# Patient Record
Sex: Male | Born: 1990 | Race: White | Hispanic: No | Marital: Single | Smoking: Current every day smoker
Health system: Southern US, Community
[De-identification: ages and names within clinical notes are randomized; demographics above are authoritative.]

---

## 2010-06-06 ENCOUNTER — Emergency Department (HOSPITAL_COMMUNITY): Admission: EM | Admit: 2010-06-06 | Discharge: 2010-06-06 | Payer: Self-pay | Admitting: Emergency Medicine

## 2013-05-13 ENCOUNTER — Emergency Department (HOSPITAL_COMMUNITY): Payer: Self-pay

## 2013-05-13 ENCOUNTER — Emergency Department (HOSPITAL_COMMUNITY)
Admission: EM | Admit: 2013-05-13 | Discharge: 2013-05-13 | Discharge status: Against Medical Advice | Payer: Self-pay | Attending: Emergency Medicine | Admitting: Emergency Medicine

## 2013-05-13 ENCOUNTER — Encounter (HOSPITAL_COMMUNITY): Payer: Self-pay | Admitting: *Deleted

## 2013-05-13 DIAGNOSIS — S0993XA Unspecified injury of face, initial encounter: Secondary | ICD-10-CM | POA: Insufficient documentation

## 2013-05-13 DIAGNOSIS — T07XXXA Unspecified multiple injuries, initial encounter: Secondary | ICD-10-CM | POA: Insufficient documentation

## 2013-05-13 DIAGNOSIS — IMO0002 Reserved for concepts with insufficient information to code with codable children: Secondary | ICD-10-CM | POA: Insufficient documentation

## 2013-05-13 DIAGNOSIS — F17200 Nicotine dependence, unspecified, uncomplicated: Secondary | ICD-10-CM | POA: Insufficient documentation

## 2013-05-13 DIAGNOSIS — S0990XA Unspecified injury of head, initial encounter: Secondary | ICD-10-CM | POA: Insufficient documentation
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
## 2013-05-13 NOTE — ED Notes (Signed)
Pt not in room per x-ray, unable to locate pt
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
## 2013-05-14 NOTE — ED Provider Notes (Signed)
Medical screening examination/treatment/procedure(s) were performed by non-physician practitioner and as supervising physician I was immediately available for consultation/collaboration.   Glynn Octave, MD 05/14/13 [phone]

## 2013-05-31 ENCOUNTER — Emergency Department (HOSPITAL_COMMUNITY)
Admission: EM | Admit: 2013-05-31 | Discharge: 2013-05-31 | Disposition: A | Discharge status: Home / Self Care | Payer: Self-pay | Attending: Emergency Medicine | Admitting: Emergency Medicine

## 2013-05-31 ENCOUNTER — Emergency Department (HOSPITAL_COMMUNITY): Payer: Self-pay

## 2013-05-31 ENCOUNTER — Encounter (HOSPITAL_COMMUNITY): Payer: Self-pay | Admitting: Emergency Medicine

## 2013-05-31 DIAGNOSIS — Q762 Congenital spondylolisthesis: Secondary | ICD-10-CM | POA: Insufficient documentation

## 2013-05-31 DIAGNOSIS — Y9389 Activity, other specified: Secondary | ICD-10-CM | POA: Insufficient documentation

## 2013-05-31 DIAGNOSIS — M4306 Spondylolysis, lumbar region: Secondary | ICD-10-CM

## 2013-05-31 DIAGNOSIS — IMO0002 Reserved for concepts with insufficient information to code with codable children: Secondary | ICD-10-CM | POA: Insufficient documentation

## 2013-05-31 DIAGNOSIS — Y92410 Unspecified street and highway as the place of occurrence of the external cause: Secondary | ICD-10-CM | POA: Insufficient documentation

## 2013-05-31 DIAGNOSIS — F17200 Nicotine dependence, unspecified, uncomplicated: Secondary | ICD-10-CM | POA: Insufficient documentation

## 2013-05-31 MED ORDER — OXYCODONE-ACETAMINOPHEN 5-325 MG PO TABS
1.0000 | ORAL_TABLET | Freq: Once | ORAL | Status: AC
  Administered 2013-05-31: 1 via ORAL
  Filled 2013-05-31: qty 1

## 2013-05-31 MED ORDER — OXYCODONE-ACETAMINOPHEN 5-325 MG PO TABS: 1.0000 | ORAL_TABLET | ORAL | Status: DC | PRN
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
## 2013-06-02 NOTE — ED Provider Notes (Signed)
Medical screening examination/treatment/procedure(s) were performed by non-physician practitioner and as supervising physician I was immediately available for consultation/collaboration.  Adelaida Reindel, MD 06/02/13 1238 
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
## 2014-02-07 ENCOUNTER — Encounter (HOSPITAL_COMMUNITY): Payer: Self-pay | Admitting: Emergency Medicine

## 2014-02-07 ENCOUNTER — Emergency Department (HOSPITAL_COMMUNITY)
Admission: EM | Admit: 2014-02-07 | Discharge: 2014-02-07 | Disposition: A | Discharge status: Home / Self Care | Payer: Self-pay | Attending: Emergency Medicine | Admitting: Emergency Medicine

## 2014-02-07 DIAGNOSIS — R51 Headache: Secondary | ICD-10-CM | POA: Insufficient documentation

## 2014-02-07 DIAGNOSIS — K089 Disorder of teeth and supporting structures, unspecified: Secondary | ICD-10-CM | POA: Insufficient documentation

## 2014-02-07 DIAGNOSIS — F17200 Nicotine dependence, unspecified, uncomplicated: Secondary | ICD-10-CM | POA: Insufficient documentation

## 2014-02-07 DIAGNOSIS — K0889 Other specified disorders of teeth and supporting structures: Secondary | ICD-10-CM

## 2014-02-07 MED ORDER — HYDROCODONE-ACETAMINOPHEN 5-325 MG PO TABS
ORAL_TABLET | ORAL | Status: AC
  Filled 2014-02-07: qty 2

## 2014-02-07 MED ORDER — AMOXICILLIN 500 MG PO CAPS: 500.0000 mg | ORAL_CAPSULE | Freq: Three times a day (TID) | ORAL | Status: DC

## 2014-02-07 MED ORDER — HYDROCODONE-ACETAMINOPHEN 5-325 MG PO TABS
2.0000 | ORAL_TABLET | Freq: Once | ORAL | Status: AC
  Administered 2014-02-07: 2 via ORAL

## 2014-02-07 MED ORDER — HYDROCODONE-ACETAMINOPHEN 5-325 MG PO TABS: 2.0000 | ORAL_TABLET | ORAL | Status: DC | PRN

## 2014-02-07 NOTE — ED Provider Notes (Signed)
Medical screening examination/treatment/procedure(s) were performed by non-physician practitioner and as supervising physician I was immediately available for consultation/collaboration.  Gerhard Munchobert Deklen Popelka, MD 02/07/14 [phone]

## 2014-02-07 NOTE — ED Provider Notes (Signed)
CSN: [id number]     Arrival date & time 02/07/14  1839 History   First MD Initiated Contact with Patient 02/07/14 1919     Chief Complaint  Patient presents with  . Dental Pain     (Consider location/radiation/quality/duration/timing/severity/associated sxs/prior Treatment) Patient is a 23 y.o. male presenting with tooth pain. The history is provided by the patient. No language interpreter was used.  Dental Pain Location:  Lower Lower teeth location:  20/LL 2nd bicuspid Quality:  Aching Severity:  Moderate Onset quality:  Gradual Duration:  1 day Timing:  Constant Progression:  Worsening Chronicity:  New Relieved by:  Nothing Worsened by:  Nothing tried Ineffective treatments:  None tried Associated symptoms: facial pain   Associated symptoms: no congestion   Risk factors: no alcohol problem     History reviewed. No pertinent past medical history. History reviewed. No pertinent past surgical history. History reviewed. No pertinent family history. History  Substance Use Topics  . Smoking status: Current Every Day Smoker -- 1.00 packs/day    Types: Cigarettes  . Smokeless tobacco: Never Used  . Alcohol Use: Yes     Comment: social    Review of Systems  HENT: Positive for dental problem. Negative for congestion.   All other systems reviewed and are negative.      Allergies  Review of patient's allergies indicates no known allergies.  Home Medications   Current Outpatient Rx  Name  Route  Sig  Dispense  Refill  . ibuprofen (ADVIL,MOTRIN) 800 MG tablet   Oral   Take 800 mg by mouth every 8 (eight) hours as needed.         Marland Kitchen. amoxicillin (AMOXIL) 500 MG capsule   Oral   Take 1 capsule (500 mg total) by mouth 3 (three) times daily.   21 capsule   0   . HYDROcodone-acetaminophen (NORCO/VICODIN) 5-325 MG per tablet   Oral   Take 2 tablets by mouth every 4 (four) hours as needed.   10 tablet   0   . oxyCODONE-acetaminophen (PERCOCET/ROXICET) 5-325 MG per  tablet   Oral   Take 1 tablet by mouth every 4 (four) hours as needed for pain.   20 tablet   0    BP 128/67  Pulse 82  Temp(Src) 99 F (37.2 C) (Oral)  Resp 18  Ht 6' (1.829 m)  Wt 165 lb (74.844 kg)  BMI 22.37 kg/m2  SpO2 100% Physical Exam  Nursing note and vitals reviewed. Constitutional: He appears well-developed and well-nourished.  HENT:  Head: Normocephalic.  Multiple broken teeth,    Eyes: Conjunctivae are normal. Pupils are equal, round, and reactive to light.  Neck: Normal range of motion.  Cardiovascular: Normal rate.   Pulmonary/Chest: Effort normal.  Musculoskeletal: Normal range of motion.  Neurological: He is alert.  Skin: Skin is warm.  Psychiatric: He has a normal mood and affect.    ED Course  Procedures (including critical care time) Labs Review Labs Reviewed - No data to display Imaging Review No results found.   EKG Interpretation None      MDM   Final diagnoses:  Toothache    Hydrocodone and amoxicillian   Dental referrals    Elson AreasLeslie K Sofia, PA-C 02/07/14 1945

## 2014-02-07 NOTE — ED Notes (Signed)
Pt c/o L lower jaw dental pain, onset last night.

## 2014-02-07 NOTE — Discharge Instructions (Signed)

## 2014-10-24 ENCOUNTER — Emergency Department (HOSPITAL_COMMUNITY): Payer: No Typology Code available for payment source

## 2014-10-24 ENCOUNTER — Emergency Department (HOSPITAL_COMMUNITY)
Admission: EM | Admit: 2014-10-24 | Discharge: 2014-10-24 | Disposition: A | Discharge status: Home / Self Care | Payer: No Typology Code available for payment source | Attending: Emergency Medicine | Admitting: Emergency Medicine

## 2014-10-24 ENCOUNTER — Encounter (HOSPITAL_COMMUNITY): Payer: Self-pay | Admitting: Cardiology

## 2014-10-24 DIAGNOSIS — Y9389 Activity, other specified: Secondary | ICD-10-CM | POA: Insufficient documentation

## 2014-10-24 DIAGNOSIS — Y998 Other external cause status: Secondary | ICD-10-CM | POA: Insufficient documentation

## 2014-10-24 DIAGNOSIS — F1110 Opioid abuse, uncomplicated: Secondary | ICD-10-CM | POA: Insufficient documentation

## 2014-10-24 DIAGNOSIS — F1310 Sedative, hypnotic or anxiolytic abuse, uncomplicated: Secondary | ICD-10-CM | POA: Insufficient documentation

## 2014-10-24 DIAGNOSIS — S0990XA Unspecified injury of head, initial encounter: Secondary | ICD-10-CM | POA: Insufficient documentation

## 2014-10-24 DIAGNOSIS — Z79899 Other long term (current) drug therapy: Secondary | ICD-10-CM | POA: Insufficient documentation

## 2014-10-24 DIAGNOSIS — Y92410 Unspecified street and highway as the place of occurrence of the external cause: Secondary | ICD-10-CM | POA: Insufficient documentation

## 2014-10-24 DIAGNOSIS — Z720 Tobacco use: Secondary | ICD-10-CM | POA: Insufficient documentation

## 2014-10-24 DIAGNOSIS — F1910 Other psychoactive substance abuse, uncomplicated: Secondary | ICD-10-CM

## 2014-10-24 LAB — BASIC METABOLIC PANEL
ANION GAP: 8 (ref 5–15)
BUN: 5 mg/dL — ABNORMAL LOW (ref 6–23)
CALCIUM: 8.6 mg/dL (ref 8.4–10.5)
CO2: 29 meq/L (ref 19–32)
Chloride: 106 mEq/L (ref 96–112)
Creatinine, Ser: 0.75 mg/dL (ref 0.50–1.35)
GFR calc non Af Amer: >90 mL/min (ref >90)
Glucose, Bld: 113 mg/dL — ABNORMAL HIGH (ref 70–99)
POTASSIUM: 4.5 meq/L (ref 3.7–5.3)
Sodium: 143 mEq/L (ref 137–147)

## 2014-10-24 LAB — URINALYSIS, ROUTINE W REFLEX MICROSCOPIC
Bilirubin Urine: NEGATIVE
GLUCOSE, UA: NEGATIVE mg/dL
Hgb urine dipstick: NEGATIVE
Ketones, ur: NEGATIVE mg/dL
LEUKOCYTES UA: NEGATIVE
NITRITE: NEGATIVE
PH: 7.5 (ref 5.0–8.0)
PROTEIN: NEGATIVE mg/dL
Specific Gravity, Urine: <1.005 — ABNORMAL LOW (ref 1.005–1.030)
Urobilinogen, UA: 0.2 mg/dL (ref 0.0–1.0)

## 2014-10-24 LAB — CBC
HEMATOCRIT: 44.2 % (ref 39.0–52.0)
HEMOGLOBIN: 14.5 g/dL (ref 13.0–17.0)
MCH: 29.1 pg (ref 26.0–34.0)
MCHC: 32.8 g/dL (ref 30.0–36.0)
MCV: 88.8 fL (ref 78.0–100.0)
PLATELETS: 163 10*3/uL (ref 150–400)
RBC: 4.98 MIL/uL (ref 4.22–5.81)
RDW: 14.4 % (ref 11.5–15.5)
WBC: 4.7 10*3/uL (ref 4.0–10.5)

## 2014-10-24 LAB — RAPID URINE DRUG SCREEN, HOSP PERFORMED
AMPHETAMINES: NOT DETECTED
Barbiturates: NOT DETECTED
Benzodiazepines: POSITIVE — AB
COCAINE: NOT DETECTED
Opiates: POSITIVE — AB
TETRAHYDROCANNABINOL: NOT DETECTED

## 2014-10-24 LAB — ETHANOL

## 2014-10-24 MED ORDER — SODIUM CHLORIDE 0.9 % IV SOLN
INTRAVENOUS | Status: DC
  Administered 2014-10-24: 16:00:00 via INTRAVENOUS

## 2014-10-24 MED ORDER — FENTANYL CITRATE 0.05 MG/ML IJ SOLN
50.0000 ug | Freq: Once | INTRAMUSCULAR | Status: AC
  Administered 2014-10-24: 50 ug via INTRAVENOUS
  Filled 2014-10-24: qty 2

## 2014-10-24 NOTE — ED Notes (Signed)
MD at bedside. 
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
## 2014-10-24 NOTE — ED Notes (Signed)
Pt up in room moving around.  Removed c-collar.  Pulled IV out. .  Mom at bedside.  Advised pt that Dr. Elesa MassedWard was notified and she was coming to see the pt in a few minutes.  Pt states "I'm  out of here".  Advised mother that if  he leaves not to let him drive.
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
## 2014-10-24 NOTE — ED Notes (Signed)
Pt pulled c-collar off and broke the collar.  New c-collar applied.  Advised pt to leave on.

## 2014-10-24 NOTE — ED Notes (Addendum)
Back board removed.  c-collar remains on.  Pt tender upper and lower back.  Advised to leave c-collar on.

## 2014-10-24 NOTE — ED Notes (Signed)
Dr. Elesa MassedWard in room to talk with pt.  Advised pt that he would not be allowed to leave,  That he will be IVC'd and forced to stay.  Commitment paperwork being completed.

## 2014-10-24 NOTE — ED Notes (Signed)
Pt stating he wants to leave. States "I am an adult. I can leave if I want to" RN notified.

## 2014-10-24 NOTE — ED Notes (Signed)
Pt in bathroom smoking.
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
## 2017-12-17 ENCOUNTER — Emergency Department (HOSPITAL_COMMUNITY)
Admission: EM | Admit: 2017-12-17 | Discharge: 2017-12-17 | Disposition: A | Discharge status: Home / Self Care | Payer: Self-pay | Attending: Emergency Medicine | Admitting: Emergency Medicine

## 2017-12-17 ENCOUNTER — Other Ambulatory Visit: Payer: Self-pay

## 2017-12-17 ENCOUNTER — Encounter (HOSPITAL_COMMUNITY): Payer: Self-pay | Admitting: Emergency Medicine

## 2017-12-17 DIAGNOSIS — F17210 Nicotine dependence, cigarettes, uncomplicated: Secondary | ICD-10-CM | POA: Insufficient documentation

## 2017-12-17 DIAGNOSIS — N489 Disorder of penis, unspecified: Secondary | ICD-10-CM | POA: Insufficient documentation

## 2017-12-17 MED ORDER — PENICILLIN G BENZATHINE 1200000 UNIT/2ML IM SUSP
2.4000 10*6.[IU] | Freq: Once | INTRAMUSCULAR | Status: AC
  Administered 2017-12-17: 2.4 10*6.[IU] via INTRAMUSCULAR
  Filled 2017-12-17: qty 4

## 2017-12-17 NOTE — ED Notes (Signed)
Pt alert & oriented x4, stable gait. Patient given discharge instructions, paperwork & prescription(s). Patient  instructed to stop at the registration desk to finish any additional paperwork. Patient verbalized understanding. Pt left department w/ no further questions. 

## 2017-12-17 NOTE — ED Notes (Signed)
Pt has abscess to the left side on the base of his penis. Site was opened last night by pt some redness & drainage noted. Area hard around the opening.

## 2017-12-17 NOTE — ED Triage Notes (Signed)
Left sided groin abscess, popped last night, continues to hurt

## 2017-12-17 NOTE — ED Provider Notes (Signed)
[facility] EMERGENCY DEPARTMENT Provider Note   CSN: [id number] Arrival date & time: 12/17/17  2009     History   Chief Complaint Chief Complaint  Patient presents with  . Abscess    HPI Leonard Vargas is a 27 y.o. male.  The history is provided by the patient. No language interpreter was used.  Abscess  Size:  3mm Abscess quality: redness   Red streaking: no   Progression:  Worsening Chronicity:  New Relieved by:  Nothing Worsened by:  Nothing Ineffective treatments:  None tried Pt complains of a sore at the base of his penis.  Pt admits to std risk  History reviewed. No pertinent past medical history.  There are no active problems to display for this patient.   History reviewed. No pertinent surgical history.     Home Medications    Prior to Admission medications   Medication Sig Start Date End Date Taking? Authorizing Provider  amoxicillin (AMOXIL) 500 MG capsule Take 1 capsule (500 mg total) by mouth 3 (three) times daily. Patient not taking: Reported on 10/24/2014 02/07/14   Elson Areas, PA-C  HYDROcodone-acetaminophen (NORCO/VICODIN) 5-325 MG per tablet Take 2 tablets by mouth every 4 (four) hours as needed. Patient not taking: Reported on 10/24/2014 02/07/14   Elson Areas, PA-C  oxyCODONE-acetaminophen (PERCOCET/ROXICET) 5-325 MG per tablet Take 1 tablet by mouth every 4 (four) hours as needed for pain. Patient not taking: Reported on 10/24/2014 05/31/13   Burgess Amor, PA-C    Family History No family history on file.  Social History Social History   Tobacco Use  . Smoking status: Current Every Day Smoker    Packs/day: 1.00    Types: Cigarettes  . Smokeless tobacco: Never Used  Substance Use Topics  . Alcohol use: Yes    Comment: social  . Drug use: No     Allergies   Patient has no known allergies.   Review of Systems Review of Systems  All other systems reviewed and are negative.    Physical Exam Updated Vital Signs BP  (!) 155/94 (BP Location: Right Arm)   Pulse 78   Temp 97.9 F (36.6 C) (Oral)   Resp 18   Ht 6' (1.829 m)   Wt 81.6 kg (180 lb)   SpO2 100%   BMI 24.41 kg/m   Physical Exam  Constitutional: He is oriented to person, place, and time. He appears well-developed and well-nourished.  Genitourinary: No penile tenderness.  Genitourinary Comments: 3mm red sore base of penis  Musculoskeletal: Normal range of motion.  Neurological: He is alert and oriented to person, place, and time.  Skin: Skin is warm.  Psychiatric: He has a normal mood and affect.  Nursing note and vitals reviewed.    ED Treatments / Results  Labs (all labs ordered are listed, but only abnormal results are displayed) Labs Reviewed  RPR  GC/CHLAMYDIA PROBE AMP ([place]) NOT AT [facility]    EKG  EKG Interpretation None       Radiology No results found.  Procedures Procedures (including critical care time)  Medications Ordered in ED Medications  penicillin g benzathine (BICILLIN LA) 1200000 UNIT/2ML injection 2.4 Million Units (not administered)     Initial Impression / Assessment and Plan / ED Course  I have reviewed the triage vital signs and the nursing notes.  Pertinent labs & imaging results that were available during my care of the patient were reviewed by me and considered in my medical decision  making (see chart for details).     Pt counseled on std risk, safe sex.  Pt advised to follow up at health department for recheck  Final Clinical Impressions(s) / ED Diagnoses   Final diagnoses:  Penile lesion    ED Discharge Orders    None     Meds ordered this encounter  Medications  . penicillin g benzathine (BICILLIN LA) 1200000 UNIT/2ML injection 2.4 Million Units    Order Specific Question:   Antibiotic Indication:    Answer:   Syphilis      Osie CheeksSofia, Leslie K, PA-C 12/17/17 2136    Mancel BaleWentz, Elliott, MD 12/17/17 [phone]

## 2017-12-19 LAB — GC/CHLAMYDIA PROBE AMP (~~LOC~~) NOT AT ARMC
Chlamydia: NEGATIVE
Neisseria Gonorrhea: POSITIVE — AB

## 2017-12-19 LAB — RPR: RPR: NONREACTIVE

## 2023-05-25 ENCOUNTER — Other Ambulatory Visit: Payer: Self-pay

## 2023-05-25 ENCOUNTER — Emergency Department (HOSPITAL_COMMUNITY): Payer: 59

## 2023-05-25 ENCOUNTER — Emergency Department (HOSPITAL_COMMUNITY)
Admission: EM | Admit: 2023-05-25 | Discharge: 2023-05-25 | Disposition: A | Discharge status: Home / Self Care | Payer: 59 | Attending: Emergency Medicine | Admitting: Emergency Medicine

## 2023-05-25 DIAGNOSIS — R000 Tachycardia, unspecified: Secondary | ICD-10-CM | POA: Insufficient documentation

## 2023-05-25 DIAGNOSIS — R519 Headache, unspecified: Secondary | ICD-10-CM | POA: Insufficient documentation

## 2023-05-25 DIAGNOSIS — M79641 Pain in right hand: Secondary | ICD-10-CM | POA: Diagnosis not present

## 2023-05-25 DIAGNOSIS — Y92410 Unspecified street and highway as the place of occurrence of the external cause: Secondary | ICD-10-CM | POA: Diagnosis not present

## 2023-05-25 LAB — COMPREHENSIVE METABOLIC PANEL
ALT: 15 U/L (ref 0–44)
AST: 20 U/L (ref 15–41)
Albumin: 3.5 g/dL (ref 3.5–5.0)
Alkaline Phosphatase: 53 U/L (ref 38–126)
Anion gap: 8 (ref 5–15)
BUN: 10 mg/dL (ref 6–20)
CO2: 26 mmol/L (ref 22–32)
Calcium: 8.4 mg/dL — ABNORMAL LOW (ref 8.9–10.3)
Chloride: 102 mmol/L (ref 98–111)
Creatinine, Ser: 0.75 mg/dL (ref 0.61–1.24)
GFR, Estimated: >60 mL/min (ref >60)
Glucose, Bld: 113 mg/dL — ABNORMAL HIGH (ref 70–99)
Potassium: 4 mmol/L (ref 3.5–5.1)
Sodium: 136 mmol/L (ref 135–145)
Total Bilirubin: 0.4 mg/dL (ref 0.3–1.2)
Total Protein: 7.3 g/dL (ref 6.5–8.1)

## 2023-05-25 LAB — CBC WITH DIFFERENTIAL/PLATELET
Abs Immature Granulocytes: 0.04 10*3/uL (ref 0.00–0.07)
Basophils Absolute: 0 10*3/uL (ref 0.0–0.1)
Basophils Relative: 0 %
Eosinophils Absolute: 0.1 10*3/uL (ref 0.0–0.5)
Eosinophils Relative: 1 %
HCT: 36.8 % — ABNORMAL LOW (ref 39.0–52.0)
Hemoglobin: 12.1 g/dL — ABNORMAL LOW (ref 13.0–17.0)
Immature Granulocytes: 1 %
Lymphocytes Relative: 25 %
Lymphs Abs: 1.6 10*3/uL (ref 0.7–4.0)
MCH: 29.3 pg (ref 26.0–34.0)
MCHC: 32.9 g/dL (ref 30.0–36.0)
MCV: 89.1 fL (ref 80.0–100.0)
Monocytes Absolute: 0.5 10*3/uL (ref 0.1–1.0)
Monocytes Relative: 8 %
Neutro Abs: 4.4 10*3/uL (ref 1.7–7.7)
Neutrophils Relative %: 65 %
Platelets: 203 10*3/uL (ref 150–400)
RBC: 4.13 MIL/uL — ABNORMAL LOW (ref 4.22–5.81)
RDW: 11.9 % (ref 11.5–15.5)
WBC Morphology: ABNORMAL
WBC: 6.2 10*3/uL (ref 4.0–10.5)
nRBC: 0 % (ref 0.0–0.2)

## 2023-05-25 LAB — CBG MONITORING, ED: Glucose-Capillary: 104 mg/dL — ABNORMAL HIGH (ref 70–99)

## 2023-05-25 MED ORDER — SODIUM CHLORIDE 0.9 % IV BOLUS: 1000.0000 mL | Freq: Once | INTRAVENOUS | Status: DC

## 2023-05-25 MED ORDER — NALOXONE HCL 4 MG/0.1ML NA LIQD: 1.0000 | Freq: Once | NASAL | 0 refills | Status: AC
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
## 2023-05-25 NOTE — ED Notes (Signed)
Nurse notified of patient nodding off in bed. See documentation for vitals.

## 2023-05-25 NOTE — ED Notes (Signed)
Pt noted to have decreased LOC. Endorses drug use. Upgrading ESI to 2 based on decreased LOC and VS.
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
## 2023-05-25 NOTE — ED Notes (Signed)
Pt with desat to 82%, RR 4, HR 132, diaphoretic. SaO2 and RR improved to 92% and RR 8 when patient awoke with sternal rub. Pinpoint pupils. Notified Dr. Particia Nearing, coming to assess patient.

## 2023-05-25 NOTE — ED Notes (Signed)
Pt stated he would like to leave AMA at this time. Particia Nearing, MD made aware. Pt in NAD.

## 2023-05-25 NOTE — ED Notes (Signed)
EDP at bedside  

## 2023-08-01 ENCOUNTER — Other Ambulatory Visit: Payer: Self-pay

## 2023-08-01 ENCOUNTER — Encounter (HOSPITAL_COMMUNITY): Payer: Self-pay | Admitting: Emergency Medicine

## 2023-08-01 ENCOUNTER — Emergency Department (HOSPITAL_COMMUNITY)
Admission: EM | Admit: 2023-08-01 | Discharge: 2023-08-01 | Discharge status: Against Medical Advice | Payer: 59 | Attending: Emergency Medicine | Admitting: Emergency Medicine

## 2023-08-01 ENCOUNTER — Emergency Department (HOSPITAL_COMMUNITY): Payer: 59

## 2023-08-01 DIAGNOSIS — N50819 Testicular pain, unspecified: Secondary | ICD-10-CM | POA: Insufficient documentation

## 2023-08-01 DIAGNOSIS — Z113 Encounter for screening for infections with a predominantly sexual mode of transmission: Secondary | ICD-10-CM | POA: Diagnosis present

## 2023-08-01 DIAGNOSIS — R791 Abnormal coagulation profile: Secondary | ICD-10-CM | POA: Insufficient documentation

## 2023-08-01 DIAGNOSIS — R000 Tachycardia, unspecified: Secondary | ICD-10-CM | POA: Diagnosis not present

## 2023-08-01 DIAGNOSIS — R3915 Urgency of urination: Secondary | ICD-10-CM | POA: Diagnosis not present

## 2023-08-01 DIAGNOSIS — R509 Fever, unspecified: Secondary | ICD-10-CM | POA: Diagnosis not present

## 2023-08-01 LAB — COMPREHENSIVE METABOLIC PANEL
ALT: 13 U/L (ref 0–44)
AST: 17 U/L (ref 15–41)
Albumin: 3.7 g/dL (ref 3.5–5.0)
Alkaline Phosphatase: 57 U/L (ref 38–126)
Anion gap: 8 (ref 5–15)
BUN: 8 mg/dL (ref 6–20)
CO2: 30 mmol/L (ref 22–32)
Calcium: 8.4 mg/dL — ABNORMAL LOW (ref 8.9–10.3)
Chloride: 98 mmol/L (ref 98–111)
Creatinine, Ser: 0.76 mg/dL (ref 0.61–1.24)
GFR, Estimated: >60 mL/min (ref >60)
Glucose, Bld: 124 mg/dL — ABNORMAL HIGH (ref 70–99)
Potassium: 3.7 mmol/L (ref 3.5–5.1)
Sodium: 136 mmol/L (ref 135–145)
Total Bilirubin: 0.6 mg/dL (ref 0.3–1.2)
Total Protein: 7.5 g/dL (ref 6.5–8.1)

## 2023-08-01 LAB — PROTIME-INR
INR: 1.2 (ref 0.8–1.2)
Prothrombin Time: 15.2 s (ref 11.4–15.2)

## 2023-08-01 LAB — CBC WITH DIFFERENTIAL/PLATELET
Abs Immature Granulocytes: 0.03 10*3/uL (ref 0.00–0.07)
Basophils Absolute: 0 10*3/uL (ref 0.0–0.1)
Basophils Relative: 0 %
Eosinophils Absolute: 0.1 10*3/uL (ref 0.0–0.5)
Eosinophils Relative: 1 %
HCT: 38.7 % — ABNORMAL LOW (ref 39.0–52.0)
Hemoglobin: 12.2 g/dL — ABNORMAL LOW (ref 13.0–17.0)
Immature Granulocytes: 0 %
Lymphocytes Relative: 14 %
Lymphs Abs: 1.1 10*3/uL (ref 0.7–4.0)
MCH: 27.7 pg (ref 26.0–34.0)
MCHC: 31.5 g/dL (ref 30.0–36.0)
MCV: 88 fL (ref 80.0–100.0)
Monocytes Absolute: 0.6 10*3/uL (ref 0.1–1.0)
Monocytes Relative: 8 %
Neutro Abs: 5.9 10*3/uL (ref 1.7–7.7)
Neutrophils Relative %: 77 %
Platelets: 226 10*3/uL (ref 150–400)
RBC: 4.4 MIL/uL (ref 4.22–5.81)
RDW: 13.1 % (ref 11.5–15.5)
WBC: 7.8 10*3/uL (ref 4.0–10.5)
nRBC: 0 % (ref 0.0–0.2)

## 2023-08-01 LAB — LACTIC ACID, PLASMA: Lactic Acid, Venous: 1.1 mmol/L (ref 0.5–1.9)

## 2023-08-01 NOTE — ED Notes (Signed)
Pt not in room.  Pt did not notify anyone he was leaving.

## 2023-08-01 NOTE — ED Triage Notes (Addendum)
Pt reports he wants to be tested for STDs. Pt also reports "I have herpes A and B and need some medication for that to." PT does report smoking meth before arriving at the ER.
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
## 2023-08-02 LAB — BLOOD CULTURE ID PANEL (REFLEXED) - BCID2

## 2023-08-02 LAB — HIV ANTIBODY (ROUTINE TESTING W REFLEX): HIV Screen 4th Generation wRfx: NONREACTIVE

## 2023-08-02 LAB — RPR: RPR Ser Ql: NONREACTIVE

## 2023-08-02 NOTE — ED Notes (Signed)
Date and time results received: 08/02/23 2308   Test: Blood Culture Aerobic Btl 1 of 4 Critical Value: Staph Aureus  Name of Provider Notified: Clelia Schaumann MD

## 2023-08-02 NOTE — ED Notes (Signed)
Spoke with pt informing him that [facility] were positive and Dr Estell Harpin recommends he come back for treatment. Pt says he will come in the morning.

## 2023-08-03 ENCOUNTER — Encounter: Payer: Self-pay | Admitting: Infectious Diseases

## 2023-08-03 ENCOUNTER — Emergency Department (HOSPITAL_COMMUNITY)
Admission: EM | Admit: 2023-08-03 | Discharge: 2023-08-03 | Discharge status: Against Medical Advice | Payer: 59 | Attending: Emergency Medicine | Admitting: Emergency Medicine

## 2023-08-03 ENCOUNTER — Other Ambulatory Visit: Payer: Self-pay

## 2023-08-03 ENCOUNTER — Encounter (HOSPITAL_COMMUNITY): Payer: Self-pay

## 2023-08-03 DIAGNOSIS — M25531 Pain in right wrist: Secondary | ICD-10-CM | POA: Diagnosis not present

## 2023-08-03 DIAGNOSIS — Z5321 Procedure and treatment not carried out due to patient leaving prior to being seen by health care provider: Secondary | ICD-10-CM | POA: Insufficient documentation

## 2023-08-03 DIAGNOSIS — M79631 Pain in right forearm: Secondary | ICD-10-CM | POA: Diagnosis not present

## 2023-08-03 DIAGNOSIS — R509 Fever, unspecified: Secondary | ICD-10-CM | POA: Diagnosis present

## 2023-08-03 NOTE — ED Triage Notes (Signed)
"  Was here last night and had test done and was called to come back in for infection in my blood" per pt

## 2023-08-03 NOTE — ED Provider Triage Note (Cosign Needed)
Emergency Medicine Provider Triage Evaluation Note  Leonard Vargas , a 32 y.o. male  was evaluated in triage.  Pt complains of fever to 101 yesterday and 2 tender lesions that have come up on his right forearm and wrist.  He was seen here 2 nights ago with multiple complaints, reports being in IV drug user of fentanyl.  He had blood cultures drawn 2 nights ago and the cultures are positive for Staphylococcus.  He was called regarding these results to come in for further treatment of this finding.  Aside from the lesions on his forearm and the fever he has no other complaints at this time.  I discussed with patient that he will need IV antibiotics and it will be recommended that he be admitted as he will need more than 1 dose of antibiotics as he recovers from this infection.  Initially he was hesitant stating he will need to come back as he is an addict and needs his "fix" before considering getting admitted.  I assured patient that we can provide a medication to help him with any withdrawal symptoms while here.  He is agreeable.  Review of Systems  Positive: Fever, right forearm and wrist lesions Negative: Cough, chest pain, shortness of breath, nausea vomiting, abdominal pain  Physical Exam  BP 125/84   Pulse 94   Temp 98.2 F (36.8 C) (Oral)   Resp 18   Ht 6' (1.829 m)   Wt 79.4 kg   SpO2 100%   BMI 23.73 kg/m  Gen:   Awake, no distress   Resp:  Normal effort  MSK:   Moves extremities without difficulty  Other:  Raised erythematous lesion right dorsal wrist, suspicious for abscess, there is a macular wet lesion on his distal right forearm as well, patient confirms these are injection sites. Medical Decision Making  Medically screening exam initiated at 2:03 PM.  Appropriate orders placed.  Leonard Vargas was informed that the remainder of the evaluation will be completed by another provider, this initial triage assessment does not replace that evaluation, and the importance of remaining  in the ED until their evaluation is complete.     Burgess Amor, PA-C 08/03/23 1410

## 2023-08-03 NOTE — Progress Notes (Unsigned)
Called patient at his phone number *2 [phone] ) regarding his + blood cx for MSSA, unable to reach.  Odette Fraction, MD Infectious Disease Physician [facility] [address] [place], [place] [address] Phone: [phone]  Fax: [phone]

## 2023-08-04 ENCOUNTER — Emergency Department (HOSPITAL_COMMUNITY): Payer: 59

## 2023-08-04 ENCOUNTER — Observation Stay (HOSPITAL_COMMUNITY)
Admission: EM | Admit: 2023-08-04 | Discharge: 2023-08-05 | Disposition: A | Discharge status: Home / Self Care | Payer: 59 | Attending: Emergency Medicine | Admitting: Emergency Medicine

## 2023-08-04 ENCOUNTER — Encounter (HOSPITAL_COMMUNITY): Payer: Self-pay

## 2023-08-04 DIAGNOSIS — Z1152 Encounter for screening for COVID-19: Secondary | ICD-10-CM | POA: Diagnosis not present

## 2023-08-04 DIAGNOSIS — R7881 Bacteremia: Secondary | ICD-10-CM | POA: Diagnosis not present

## 2023-08-04 DIAGNOSIS — F1910 Other psychoactive substance abuse, uncomplicated: Secondary | ICD-10-CM | POA: Insufficient documentation

## 2023-08-04 DIAGNOSIS — M71031 Abscess of bursa, right wrist: Principal | ICD-10-CM | POA: Insufficient documentation

## 2023-08-04 DIAGNOSIS — F17200 Nicotine dependence, unspecified, uncomplicated: Secondary | ICD-10-CM | POA: Insufficient documentation

## 2023-08-04 DIAGNOSIS — F1190 Opioid use, unspecified, uncomplicated: Secondary | ICD-10-CM | POA: Insufficient documentation

## 2023-08-04 LAB — CBC WITH DIFFERENTIAL/PLATELET
Abs Immature Granulocytes: 0.01 10*3/uL (ref 0.00–0.07)
Basophils Absolute: 0 10*3/uL (ref 0.0–0.1)
Basophils Relative: 0 %
Eosinophils Absolute: 0.3 10*3/uL (ref 0.0–0.5)
Eosinophils Relative: 4 %
HCT: 42.1 % (ref 39.0–52.0)
Hemoglobin: 13.1 g/dL (ref 13.0–17.0)
Immature Granulocytes: 0 %
Lymphocytes Relative: 31 %
Lymphs Abs: 2.4 10*3/uL (ref 0.7–4.0)
MCH: 27.5 pg (ref 26.0–34.0)
MCHC: 31.1 g/dL (ref 30.0–36.0)
MCV: 88.3 fL (ref 80.0–100.0)
Monocytes Absolute: 0.4 10*3/uL (ref 0.1–1.0)
Monocytes Relative: 6 %
Neutro Abs: 4.5 10*3/uL (ref 1.7–7.7)
Neutrophils Relative %: 59 %
Platelets: 256 10*3/uL (ref 150–400)
RBC: 4.77 MIL/uL (ref 4.22–5.81)
RDW: 13.3 % (ref 11.5–15.5)
WBC: 7.6 10*3/uL (ref 4.0–10.5)
nRBC: 0 % (ref 0.0–0.2)

## 2023-08-04 LAB — URINALYSIS, W/ REFLEX TO CULTURE (INFECTION SUSPECTED)
Bacteria, UA: NONE SEEN
Bilirubin Urine: NEGATIVE
Glucose, UA: NEGATIVE mg/dL
Hgb urine dipstick: NEGATIVE
Ketones, ur: NEGATIVE mg/dL
Leukocytes,Ua: NEGATIVE
Nitrite: NEGATIVE
Protein, ur: NEGATIVE mg/dL
Specific Gravity, Urine: 1.012 (ref 1.005–1.030)
pH: 7 (ref 5.0–8.0)

## 2023-08-04 LAB — RESP PANEL BY RT-PCR (RSV, FLU A&B, COVID)  RVPGX2
Influenza A by PCR: NEGATIVE
Influenza B by PCR: NEGATIVE
Resp Syncytial Virus by PCR: NEGATIVE
SARS Coronavirus 2 by RT PCR: NEGATIVE

## 2023-08-04 LAB — PROTIME-INR
INR: 1.1 (ref 0.8–1.2)
Prothrombin Time: 14.1 s (ref 11.4–15.2)

## 2023-08-04 LAB — CULTURE, BLOOD (ROUTINE X 2): Special Requests: ADEQUATE

## 2023-08-04 LAB — COMPREHENSIVE METABOLIC PANEL
ALT: 13 U/L (ref 0–44)
AST: 16 U/L (ref 15–41)
Albumin: 4.1 g/dL (ref 3.5–5.0)
Alkaline Phosphatase: 59 U/L (ref 38–126)
Anion gap: 10 (ref 5–15)
BUN: 9 mg/dL (ref 6–20)
CO2: 27 mmol/L (ref 22–32)
Calcium: 8.8 mg/dL — ABNORMAL LOW (ref 8.9–10.3)
Chloride: 100 mmol/L (ref 98–111)
Creatinine, Ser: 0.7 mg/dL (ref 0.61–1.24)
GFR, Estimated: >60 mL/min (ref >60)
Glucose, Bld: 91 mg/dL (ref 70–99)
Potassium: 3.9 mmol/L (ref 3.5–5.1)
Sodium: 137 mmol/L (ref 135–145)
Total Bilirubin: 0.4 mg/dL (ref 0.3–1.2)
Total Protein: 8.4 g/dL — ABNORMAL HIGH (ref 6.5–8.1)

## 2023-08-04 LAB — APTT: aPTT: 27 s (ref 24–36)

## 2023-08-04 LAB — LACTIC ACID, PLASMA
Lactic Acid, Venous: 1.4 mmol/L (ref 0.5–1.9)
Lactic Acid, Venous: 1.5 mmol/L (ref 0.5–1.9)

## 2023-08-04 MED ORDER — ONDANSETRON HCL 4 MG PO TABS: 4.0000 mg | ORAL_TABLET | Freq: Four times a day (QID) | ORAL | Status: DC | PRN

## 2023-08-04 MED ORDER — VANCOMYCIN HCL IN DEXTROSE 1-5 GM/200ML-% IV SOLN
1000.0000 mg | Freq: Once | INTRAVENOUS | Status: AC
  Administered 2023-08-04: 1000 mg via INTRAVENOUS
  Filled 2023-08-04: qty 200

## 2023-08-04 MED ORDER — ACETAMINOPHEN 325 MG PO TABS: 650.0000 mg | ORAL_TABLET | Freq: Four times a day (QID) | ORAL | Status: DC | PRN

## 2023-08-04 MED ORDER — LACTATED RINGERS IV SOLN: INTRAVENOUS | Status: AC

## 2023-08-04 MED ORDER — NICOTINE 21 MG/24HR TD PT24
21.0000 mg | MEDICATED_PATCH | Freq: Every day | TRANSDERMAL | Status: DC
  Administered 2023-08-04 – 2023-08-05 (×2): 21 mg via TRANSDERMAL
  Filled 2023-08-04 (×2): qty 1

## 2023-08-04 MED ORDER — HEPARIN SODIUM (PORCINE) 5000 UNIT/ML IJ SOLN: 5000.0000 [IU] | Freq: Three times a day (TID) | INTRAMUSCULAR | Status: DC

## 2023-08-04 MED ORDER — VANCOMYCIN HCL 750 MG/150ML IV SOLN: 750.0000 mg | INTRAVENOUS | Status: DC

## 2023-08-04 MED ORDER — ADULT MULTIVITAMIN W/MINERALS CH
1.0000 | ORAL_TABLET | Freq: Every day | ORAL | Status: DC
  Administered 2023-08-04 – 2023-08-05 (×2): 1 via ORAL
  Filled 2023-08-04 (×2): qty 1

## 2023-08-04 MED ORDER — LIDOCAINE HCL (PF) 1 % IJ SOLN
30.0000 mL | Freq: Once | INTRAMUSCULAR | Status: AC
  Administered 2023-08-04: 30 mL
  Filled 2023-08-04: qty 30

## 2023-08-04 MED ORDER — LACTATED RINGERS IV BOLUS (SEPSIS): 1000.0000 mL | Freq: Once | INTRAVENOUS | Status: DC

## 2023-08-04 MED ORDER — CEFAZOLIN SODIUM-DEXTROSE 2-4 GM/100ML-% IV SOLN
2.0000 g | Freq: Three times a day (TID) | INTRAVENOUS | Status: DC
  Administered 2023-08-04 – 2023-08-05 (×2): 2 g via INTRAVENOUS
  Filled 2023-08-04 (×2): qty 100

## 2023-08-04 MED ORDER — LACTATED RINGERS IV BOLUS (SEPSIS): 500.0000 mL | Freq: Once | INTRAVENOUS | Status: DC

## 2023-08-04 MED ORDER — METHOCARBAMOL 1000 MG/10ML IJ SOLN: 500.0000 mg | Freq: Four times a day (QID) | INTRAVENOUS | Status: DC | PRN

## 2023-08-04 MED ORDER — KETOROLAC TROMETHAMINE 15 MG/ML IJ SOLN
15.0000 mg | Freq: Once | INTRAMUSCULAR | Status: AC
  Administered 2023-08-04: 15 mg via INTRAVENOUS
  Filled 2023-08-04: qty 1

## 2023-08-04 MED ORDER — ACETAMINOPHEN 650 MG RE SUPP: 650.0000 mg | Freq: Four times a day (QID) | RECTAL | Status: DC | PRN

## 2023-08-04 MED ORDER — KETOROLAC TROMETHAMINE 15 MG/ML IJ SOLN
15.0000 mg | Freq: Four times a day (QID) | INTRAMUSCULAR | Status: DC | PRN
  Administered 2023-08-05: 15 mg via INTRAVENOUS
  Filled 2023-08-04: qty 1

## 2023-08-04 MED ORDER — ONDANSETRON HCL 4 MG/2ML IJ SOLN: 4.0000 mg | Freq: Four times a day (QID) | INTRAMUSCULAR | Status: DC | PRN

## 2023-08-04 MED ORDER — LACTATED RINGERS IV BOLUS (SEPSIS)
1000.0000 mL | Freq: Once | INTRAVENOUS | Status: AC
  Administered 2023-08-04: 1000 mL via INTRAVENOUS

## 2023-08-04 NOTE — ED Provider Notes (Signed)
[name] EMERGENCY DEPARTMENT AT [facility] Provider Note   CSN: [id number] Arrival date & time: 08/04/23  1316     History  Chief Complaint  Patient presents with   Abnormal Lab    Leonard Vargas is a 32 y.o. male.  PMH of IV fentanyl abuse, has currently been using 3 times a day since May.  Presents to the ER for evaluation due to being called about positive blood culture.  He was seen in the ER on 9/5 for exposure to syphilis and having fevers in the setting of IV fentanyl use, had labs drawn and blood cultures.  Patient left before his workup was complete was called yesterday for positive blood cultures and came back.  Was unable to stay for treatment and he stated he needed to go home.  Is been having a bit of fevers, has developed an abscess to the dorsum of the right wrist.  He states is not an area that he normally injects, he had had a small infection to the right forearm a couple of days ago where he had missed a vein and injected he states this area has gotten better but has a large boil on the right wrist, states it drained pus last night after he squeezed it.  No limitation of the joint or joint swelling.  Continues to have intermittent fevers  He did go to urgent care on 9/5 as well and had temperature they recorded at 101.2 Fahrenheit.     Abnormal Lab      Home Medications Prior to Admission medications   Medication Sig Start Date End Date Taking? Authorizing Provider  naloxone [facility]) nasal spray 4 mg/0.1 mL Place 1 spray into the nose once. 05/26/23  Yes [provider]      Allergies    Patient has no known allergies.    Review of Systems   Review of Systems  Physical Exam Updated Vital Signs BP 129/78   Pulse 89   Temp 99.4 F (37.4 C) (Oral)   Resp 15   Ht 6' (1.829 m)   Wt 79.4 kg   SpO2 100%   BMI 23.73 kg/m  Physical Exam Vitals and nursing note reviewed. Exam conducted with a chaperone present.  Constitutional:       General: He is not in acute distress.    Appearance: He is well-developed.  HENT:     Head: Normocephalic and atraumatic.     Mouth/Throat:     Mouth: Mucous membranes are moist.  Eyes:     Conjunctiva/sclera: Conjunctivae normal.  Cardiovascular:     Rate and Rhythm: Normal rate and regular rhythm.     Heart sounds: No murmur heard. Pulmonary:     Effort: Pulmonary effort is normal. No respiratory distress.     Breath sounds: Normal breath sounds.  Abdominal:     Palpations: Abdomen is soft.     Tenderness: There is no abdominal tenderness.  Genitourinary:    Pubic Area: No rash.      Penis: Normal.      Testes: Normal.        Right: Mass, tenderness or swelling not present.        Left: Mass, tenderness or swelling not present.  Musculoskeletal:        General: No swelling.     Cervical back: Neck supple.  Skin:    General: Skin is warm and dry.     Capillary Refill: Capillary refill takes less than 2  seconds.  Neurological:     General: No focal deficit present.     Mental Status: He is alert and oriented to person, place, and time.  Psychiatric:        Mood and Affect: Mood normal.     ED Results / Procedures / Treatments   Labs (all labs ordered are listed, but only abnormal results are displayed) Labs Reviewed  COMPREHENSIVE METABOLIC PANEL - Abnormal; Notable for the following components:      Result Value   Calcium 8.8 (*)    Total Protein 8.4 (*)    All other components within normal limits  RESP PANEL BY RT-PCR (RSV, FLU A&B, COVID)  [id number]  CULTURE, BLOOD (ROUTINE X 2)  CULTURE, BLOOD (ROUTINE X 2)  AEROBIC/ANAEROBIC CULTURE W GRAM STAIN (SURGICAL/DEEP WOUND)  LACTIC ACID, PLASMA  CBC WITH DIFFERENTIAL/PLATELET  PROTIME-INR  APTT  URINALYSIS, W/ REFLEX TO CULTURE (INFECTION SUSPECTED)  LACTIC ACID, PLASMA  GC/CHLAMYDIA PROBE AMP ([place]) NOT AT [facility]    EKG EKG Interpretation Date/Time:  Sunday August 04 2023 14:06:40 EDT Ventricular  Rate:  105 PR Interval:  154 QRS Duration:  87 QT Interval:  326 QTC Calculation: 431 R Axis:   81  Text Interpretation: Sinus tachycardia Confirmed by Eber Hong ([id number]) on 08/04/2023 3:05:11 PM  Radiology DG Chest Port 1 View  Result Date: 08/04/2023 CLINICAL DATA:  Sepsis. EXAM: PORTABLE CHEST 1 VIEW COMPARISON:  08/01/2023 FINDINGS: The heart size and mediastinal contours are within normal limits. Both lungs are clear. The visualized skeletal structures are unremarkable. IMPRESSION: No active disease. Electronically Signed   By: Danae Orleans M.D.   On: 08/04/2023 16:20    Procedures .Marland KitchenIncision and Drainage  Date/Time: 08/04/2023 3:52 PM  Performed by: Ma Rings, PA-C Authorized by: Ma Rings, PA-C   Consent:    Consent obtained:  Verbal   Consent given by:  Patient   Risks discussed:  Bleeding, incomplete drainage, pain and damage to other organs   Alternatives discussed:  No treatment Universal protocol:    Procedure explained and questions answered to patient or proxy's satisfaction: yes     Relevant documents present and verified: yes     Test results available : yes     Imaging studies available: yes     Required blood products, implants, devices, and special equipment available: yes     Site/side marked: yes     Immediately prior to procedure, a time out was called: yes     Patient identity confirmed:  Verbally with patient Location:    Type:  Abscess Pre-procedure details:    Skin preparation:  Betadine Anesthesia:    Anesthesia method:  Local infiltration   Local anesthetic:  Lidocaine 1% WITH epi Procedure type:    Complexity:  Complex Procedure details:    Ultrasound guidance: no     Incision types:  Single straight   Incision depth:  Subcutaneous   Wound management:  Probed and deloculated, irrigated with saline and extensive cleaning   Drainage:  Purulent   Drainage amount:  Moderate   Packing materials:  1/2 in iodoform  gauze Post-procedure details:    Procedure completion:  Tolerated well, no immediate complications Ultrasound ED Peripheral IV (Provider)  Date/Time: 08/04/2023 5:54 PM  Performed by: Ma Rings, PA-C Authorized by: Ma Rings, PA-C   Procedure details:    Indications: multiple failed IV attempts     Skin Prep: chlorhexidine gluconate     Location: Left  upper arm.   Angiocath:  20 G   Bedside Ultrasound Guided: Yes     Images: not archived     Patient tolerated procedure without complications: Yes     Dressing applied: Yes       Medications Ordered in ED Medications  lactated ringers infusion (has no administration in time range)  vancomycin (VANCOCIN) IVPB 1000 mg/200 mL premix (1,000 mg Intravenous New Bag/Given 08/04/23 1543)  lactated ringers bolus 1,000 mL (1,000 mLs Intravenous New Bag/Given 08/04/23 1540)  lidocaine (PF) (XYLOCAINE) 1 % injection 30 mL (30 mLs Infiltration Given 08/04/23 1541)  ketorolac (TORADOL) 15 MG/ML injection 15 mg (15 mg Intravenous Given 08/04/23 1609)    ED Course/ Medical Decision Making/ A&P                                 Medical Decision Making This patient presents to the ED for concern of blood cultures, fever, right breast abscess, this involves an extensive number of treatment options, and is a complaint that carries with it a high risk of complications and morbidity.  The differential diagnosis includes uremia, sepsis, abscess, cellulitis, other   Co morbidities that complicate the patient evaluation :   IV fentanyl abuse and dependence   Additional history obtained:  Additional history obtained from EMR External records from outside source obtained and reviewed including notes, labs   Lab Tests:  I Ordered, and personally interpreted labs.  The pertinent results include: Negative respiratory panel, normal urine, CMP reassuring, lactic acid normal, CBC normal   Imaging Studies ordered:  I ordered imaging studies  including chest x-ray which shows pulmonary edema or infiltrates I independently visualized and interpreted imaging within scope of identifying emergent findings  I agree with the radiologist interpretation   Cardiac Monitoring: / EKG:  The patient was maintained on a cardiac monitor.  I personally viewed and interpreted the cardiac monitored which showed an underlying rhythm of: Sinus rhythm  Consultations Obtained:  I requested consultation with the hospitalist,  and discussed lab and imaging findings as well as pertinent plan - they recommend: admission   Problem List / ED Course / Critical interventions / Medication management  Bacteremia-patient had 1 positive blood culture several days ago in the setting of fever, IV fentanyl abuse.  Has since developed an abscess to the dorsum of the right wrist.  I incised and drained this and obtained a wound culture.  There is no joint swelling or limitation of range of motion I do not feel he has septic arthritis at this time.  He has checked back in and left AMA since the initial blood cultures.  Has had documented fevers in outpatient setting, today temperature is 99.4.  He is given IV vancomycin, given Toradol for discomfort.  Not having active opiate withdrawal at this time.  Used fentanyl last this morning  I have reviewed the patients home medicines and have made adjustments as needed   Social Determinants of Health:  IV Drug abuse       Amount and/or Complexity of Data Reviewed Labs: ordered. Radiology: ordered. ECG/medicine tests: ordered.  Risk Prescription drug management. Decision regarding hospitalization.           Final Clinical Impression(s) / ED Diagnoses Final diagnoses:  Bacteremia  Abscess of bursa of right wrist    Rx / DC Orders ED Discharge Orders     None  Josem Kaufmann 08/04/23 Debera Lat, MD 08/04/23 [phone]

## 2023-08-04 NOTE — ED Triage Notes (Addendum)
Pt presents after being informed of positive blood culture results.  Pt reports having a wound on his R wrist.  Pain score 10/10.  Redness and swelling noted. Pt reports he "popped it last night due to severe pain."   Pt has either eloped or LWBS the past 2 days but sts he is now ready to be admitted. Sts he "had stuff to get straight."

## 2023-08-04 NOTE — ED Notes (Signed)
Delay in IV placement due to pt being a difficult stick.

## 2023-08-04 NOTE — Progress Notes (Signed)
Delay in patient receiving IV fluids and antibiotics d/t patient being a difficult stick. This RN attempted once to get an IV with no success. AC, Wendi Maya, called for [place] IV. Herbert Seta was unable to attempt IV. ED and ICU called. Waiting for [place] placement.

## 2023-08-04 NOTE — Assessment & Plan Note (Signed)
-   Blood cultures show staph - Vancomycin started in the ED - Abscess on wrist I&D'd in ED - Repeat blood cultures pending - Echo in the a.m. - Most likely due to IV drug use

## 2023-08-04 NOTE — Progress Notes (Signed)
   08/04/23 2300  Provider Notification  Provider Name/Title Dr. Carren Rang  Date Provider Notified 08/04/23  Time Provider Notified 2241  Method of Notification Page  Notification Reason Red med refusal (Heparin Refusal)  Provider response No new orders

## 2023-08-04 NOTE — H&P (Signed)
History and Physical    Patient: Leonard Vargas ZOX:[id number] DOB: 11-11-1991 DOA: 08/04/2023 DOS: the patient was seen and examined on 08/04/2023 PCP: Patient, No Pcp Per  Patient coming from: Home  Chief Complaint:  Chief Complaint  Patient presents with   Abnormal Lab   HPI: Leonard Vargas is a 32 y.o. male with medical history significant of tobacco use disorder and polysubstance abuse, presents to the ED with a chief complaint of abscess.  Patient reports he has an abscess on his right wrist.  It started about a week ago.  Is been worse since it started.  It had yellow drainage last night.  He has had a fever as high as 101.  He has not taken any Tylenol or ibuprofen for his fever.  He has burning pain associated with this abscess.  He has erythema around the abscess as well.  Patient reports he has not taken anything for pain.  His blood cultures were initially done like 3 days ago, but he left AMA because he did not feel like being here.  Patient does have a history of polysubstance abuse.  His last use of IV fentanyl and meth were this morning.  He reports he uses them both at the same time.  Patient reports he uses about a gram of fentanyl per day.  He does not drink alcohol.  Patient is full code. Review of Systems: As mentioned in the history of present illness. All other systems reviewed and are negative. History reviewed. No pertinent past medical history. History reviewed. No pertinent surgical history. Social History:  reports that he has been smoking cigarettes. He has never used smokeless tobacco. He reports current alcohol use. He reports current drug use. Drug: Methamphetamines.  No Known Allergies  History reviewed. No pertinent family history.  Prior to Admission medications   Medication Sig Start Date End Date Taking? Authorizing Provider  naloxone [facility]) nasal spray 4 mg/0.1 mL Place 1 spray into the nose once. 05/26/23  Yes [provider]    Physical  Exam: Vitals:   08/04/23 1335 08/04/23 1600 08/04/23 1730 08/04/23 1824  BP: 128/88 (!) 134/92 129/78 (!) 116/93  Pulse: (!) 102 89  85  Resp: 18 11 15 17   Temp: 99.4 F (37.4 C)   98.5 F (36.9 C)  TempSrc: Oral   Oral  SpO2: 100% 100%  100%  Weight: 79.4 kg     Height: 6' (1.829 m)      1.  General: Patient lying supine in bed,  no acute distress   2. Psychiatric: Alert and oriented x 3, mood and behavior normal for situation, pleasant and cooperative with exam   3. Neurologic: Speech and language are normal, face is symmetric, moves all 4 extremities voluntarily, at baseline without acute deficits on limited exam   4. HEENMT:  Head is atraumatic, normocephalic, pupils reactive to light, neck is supple, trachea is midline, mucous membranes are moist, poor dentition   5. Respiratory : Lungs are clear to auscultation bilaterally without wheezing, rhonchi, rales, no cyanosis, no increase in work of breathing or accessory muscle use   6. Cardiovascular : Heart rate tachycardic, rhythm is regular, no murmurs, rubs or gallops, no peripheral edema, peripheral pulses palpated   7. Gastrointestinal:  Abdomen is soft, nondistended, nontender to palpation bowel sounds active, no masses or organomegaly palpated   8. Skin:  Bandage is clean dry and intact over right wrist with erythema around the edges of the bandage  9.Musculoskeletal:  No acute deformities or trauma, no asymmetry in tone, no peripheral edema, peripheral pulses palpated, no tenderness to palpation in the extremities  Data Reviewed: In the ED Patient is afebrile, heart rate slightly tachycardic at 102, respiratory rate normal, blood pressure normal No leukocytosis Chemistry is unremarkable Lactic acid 1.4 Chest x-ray shows no active disease EKG shows sinus tachycardia with a heart rate of 105 and QTc 431 Patient was started on vancomycin Patient was changed to Ancef at admission due to MSSA and blood  cultures Toradol for pain 1 L LR given Continue LR to 150 mL/h  Patient does not have an IV when I see him at bedside.  They report that he is a difficult stick and they cannot get somebody to do an ultrasound IV.  AC has been contacted about this.  I have personally contacted a ultrasound IV RN in the ED.  Assessment and Plan: * Bacteremia - Blood cultures show staph - Vancomycin started in the ED - Continue Ancef as it is MSSA - Abscess on wrist I&D'd in ED - Repeat blood cultures pending - Echo in the a.m. - Most likely due to IV drug use  Tobacco use disorder - Nicotine patch ordered  Substance abuse (HCC) - Use of IV fentanyl and use of meth - Counseled on importance of cessation - COWS score monitoring - UDS pending       Advance Care Planning:   Code Status: Full Code  Consults: None at this time  Family Communication: No family at bedside  Severity of Illness: The appropriate patient status for this patient is OBSERVATION. Observation status is judged to be reasonable and necessary in order to provide the required intensity of service to ensure the patient's safety. The patient's presenting symptoms, physical exam findings, and initial radiographic and laboratory data in the context of their medical condition is felt to place them at decreased risk for further clinical deterioration. Furthermore, it is anticipated that the patient will be medically stable for discharge from the hospital within 2 midnights of admission.   Author: Lilyan Gilford, DO 08/04/2023 10:00 PM  For on call review [email or web address].

## 2023-08-04 NOTE — Consult Note (Addendum)
CODE SEPSIS - PHARMACY COMMUNICATION  **Broad-spectrum antimicrobials should be administered within one hour of sepsis diagnosis**  Time Code Sepsis call or page was received: 1345  Antibiotics ordered: Vancomycin  Time of first antibiotic administration: 1543  Additional action taken by pharmacy: Messaged RN. Learned that patient is a hard stick which delayed IV placement  Name of provider/RN: Corrie Dandy, RN     Will M. Dareen Piano, PharmD Clinical Pharmacist 08/04/2023 2:30 PM

## 2023-08-04 NOTE — Assessment & Plan Note (Signed)
Nicotine patch ordered.

## 2023-08-04 NOTE — Assessment & Plan Note (Addendum)
-   Use of IV fentanyl and use of meth - Counseled on importance of cessation - COWS score monitoring - UDS pending

## 2023-08-05 ENCOUNTER — Other Ambulatory Visit: Payer: Self-pay

## 2023-08-05 ENCOUNTER — Observation Stay (HOSPITAL_COMMUNITY): Payer: 59

## 2023-08-05 ENCOUNTER — Observation Stay (HOSPITAL_BASED_OUTPATIENT_CLINIC_OR_DEPARTMENT_OTHER): Payer: 59

## 2023-08-05 DIAGNOSIS — R7881 Bacteremia: Secondary | ICD-10-CM | POA: Diagnosis not present

## 2023-08-05 DIAGNOSIS — F1190 Opioid use, unspecified, uncomplicated: Secondary | ICD-10-CM | POA: Insufficient documentation

## 2023-08-05 DIAGNOSIS — B9561 Methicillin susceptible Staphylococcus aureus infection as the cause of diseases classified elsewhere: Secondary | ICD-10-CM | POA: Diagnosis not present

## 2023-08-05 DIAGNOSIS — F1910 Other psychoactive substance abuse, uncomplicated: Secondary | ICD-10-CM | POA: Diagnosis not present

## 2023-08-05 LAB — COMPREHENSIVE METABOLIC PANEL
ALT: 11 U/L (ref 0–44)
AST: 11 U/L — ABNORMAL LOW (ref 15–41)
Albumin: 3.2 g/dL — ABNORMAL LOW (ref 3.5–5.0)
Alkaline Phosphatase: 51 U/L (ref 38–126)
Anion gap: 9 (ref 5–15)
BUN: 9 mg/dL (ref 6–20)
CO2: 27 mmol/L (ref 22–32)
Calcium: 8.7 mg/dL — ABNORMAL LOW (ref 8.9–10.3)
Chloride: 102 mmol/L (ref 98–111)
Creatinine, Ser: 0.69 mg/dL (ref 0.61–1.24)
GFR, Estimated: >60 mL/min (ref >60)
Glucose, Bld: 102 mg/dL — ABNORMAL HIGH (ref 70–99)
Potassium: 3.8 mmol/L (ref 3.5–5.1)
Sodium: 138 mmol/L (ref 135–145)
Total Bilirubin: 0.4 mg/dL (ref 0.3–1.2)
Total Protein: 6.5 g/dL (ref 6.5–8.1)

## 2023-08-05 LAB — CBC WITH DIFFERENTIAL/PLATELET
Abs Immature Granulocytes: 0.01 10*3/uL (ref 0.00–0.07)
Basophils Absolute: 0 10*3/uL (ref 0.0–0.1)
Basophils Relative: 0 %
Eosinophils Absolute: 0.3 10*3/uL (ref 0.0–0.5)
Eosinophils Relative: 5 %
HCT: 35.2 % — ABNORMAL LOW (ref 39.0–52.0)
Hemoglobin: 11.1 g/dL — ABNORMAL LOW (ref 13.0–17.0)
Immature Granulocytes: 0 %
Lymphocytes Relative: 38 %
Lymphs Abs: 2.3 10*3/uL (ref 0.7–4.0)
MCH: 27.5 pg (ref 26.0–34.0)
MCHC: 31.5 g/dL (ref 30.0–36.0)
MCV: 87.1 fL (ref 80.0–100.0)
Monocytes Absolute: 0.4 10*3/uL (ref 0.1–1.0)
Monocytes Relative: 6 %
Neutro Abs: 3.1 10*3/uL (ref 1.7–7.7)
Neutrophils Relative %: 51 %
Platelets: 249 10*3/uL (ref 150–400)
RBC: 4.04 MIL/uL — ABNORMAL LOW (ref 4.22–5.81)
RDW: 13.4 % (ref 11.5–15.5)
WBC: 6 10*3/uL (ref 4.0–10.5)
nRBC: 0 % (ref 0.0–0.2)

## 2023-08-05 LAB — ECHOCARDIOGRAM COMPLETE
AR max vel: 3.75 cm2
AV Area VTI: 3.32 cm2
AV Area mean vel: 3.6 cm2
AV Mean grad: 3 mmHg
AV Peak grad: 6.1 mmHg
Ao pk vel: 1.23 m/s
Area-P 1/2: 5.13 cm2
Height: 72 in
MV VTI: 3.56 cm2
S' Lateral: 3.4 cm
Weight: 2800 [oz_av]

## 2023-08-05 LAB — GC/CHLAMYDIA PROBE AMP (~~LOC~~) NOT AT ARMC
Chlamydia: NEGATIVE
Comment: NEGATIVE
Comment: NORMAL
Neisseria Gonorrhea: NEGATIVE

## 2023-08-05 LAB — MAGNESIUM: Magnesium: 2.3 mg/dL (ref 1.7–2.4)

## 2023-08-05 MED ORDER — CLONAZEPAM 0.5 MG PO TABS: 1.0000 mg | ORAL_TABLET | Freq: Two times a day (BID) | ORAL | Status: DC

## 2023-08-05 MED ORDER — CEPHALEXIN 500 MG PO CAPS: 500.0000 mg | ORAL_CAPSULE | Freq: Four times a day (QID) | ORAL | 0 refills | Status: AC

## 2023-08-05 MED ORDER — BUPRENORPHINE HCL-NALOXONE HCL 8-2 MG SL SUBL: 1.0000 | SUBLINGUAL_TABLET | Freq: Two times a day (BID) | SUBLINGUAL | Status: DC

## 2023-08-05 MED ORDER — BUPRENORPHINE HCL-NALOXONE HCL 2-0.5 MG SL SUBL: 1.0000 | SUBLINGUAL_TABLET | SUBLINGUAL | Status: DC | PRN

## 2023-08-05 MED ORDER — CLONIDINE HCL 0.1 MG PO TABS
0.1000 mg | ORAL_TABLET | Freq: Three times a day (TID) | ORAL | Status: DC
  Administered 2023-08-05: 0.1 mg via ORAL
  Filled 2023-08-05: qty 1

## 2023-08-05 NOTE — Plan of Care (Signed)

## 2023-08-05 NOTE — Discharge Summary (Signed)
Physician Discharge Summary   Patient: Leonard Vargas MRN: [id number] DOB: 09/27/1991  Admit date:     08/04/2023  Discharge date: 08/16/2023  Discharge Physician: Leonard Vargas   PCP: Patient, No Pcp Per    LEAVING AGAINST MEDICAL ADVICE    Hospital Course: 32 year old male with a history of polysubstance abuse including intravenous fentanyl abuse presenting secondary to positive blood cultures. 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  At that time, the patient stated that he had missed his vein in his right forearm during one of his injections and developed some redness and swelling in his right forearm.  This was associate with some subjective fevers and chills.  Blood cultures and all the lab work was performed during that ED visit.  His BMP and CBC were largely unremarkable.  His RPR was nonreactive.  Unfortunately, the patient eloped prior to his workup being finished. ID physician, Leonard Vargas tried to contact the patient initially on the morning of 08/03/2023 and was unable to reach the patient. He returned to the emergency department on 08/03/2023 because of pain and redness in his right forearm and wrist.  Once again, he eloped from the emergency department.  He returned to the emergency department again on 08/04/2023 because of pain and redness in his right forearm and wrist.  He states that he had to pop the boil on the dorsum of his right wrist because of the pain.  He states he had note injected at that site for "a few months", but states he has injected distal to it in the metacarpal area. He had had a small infection to the right forearm a couple of days ago where he had missed a vein and injected;  he states this area has gotten better but more distally, he has a large boil on the right wrist, states it drained pus last night after he tried to drain it with a sewing needle.    In the ED on 08/04/2023, the abscess on the dorsum of his right wrist was incised and drained and wound culture was sent.  It was noted that the patient had no joint swelling or limitations of range of motion of his right wrist. Patient denies fevers, chills, headache, chest pain, dyspnea, nausea, vomiting, diarrhea, abdominal pain, dysuria, hematuria, hematochezia, and melena.  In the ED, the patient had low-grade temperature 99.4 F.  He was hemodynamically stable.  Oxygen saturation was 100% room air.  Blood cultures from 08/01/2023 showed MSSA in 1 of 2 sets.  Repeat blood cultures were obtained in the emergency department on 08/04/2023 at 1419 hours. WBC 7.8, hemoglobin 13.1, platelets 256,000.  Lactic acid 1.4>> 1.5.  Sodium 137, potassium 3.9, bicarbonate 27, serum creatinine 0.70.  The patient was given a dose of vancomycin initially.  He has been since started on cefazolin.  He was admitted for further evaluation and treatment.  In speaking with Leonard Vargas, he demonstrated the ability to understand the serious nature and consequences of leaving the hospital prior to completion of an adequate course of treatment. He was able to articulate the consequences of leaving prior to completion of adequate therapy, and he understood that leaving could result in bodily harm or even death.    In the morning of August 16, 2023, the primary team was informed that Leonard Vargas wished to leave the hospital against medical advice. Leonard Vargas stated that he wished to leave  the hospital prior to completing medical therapy because he felt much better, was able to eat and drink and was uncomfortable and did not want to stay. We dicussed with him the nature of his present illness, that he was being kept in the hospital due to ongoing active issues which included: arm abscess and MSSA bacteremia. It was explained that inpatient therapy was still warranted because of the risk of sepsis, metastatic MSSA infection, and death and  that the risks of leaving prior to completion of treatment and preparation of a safe discharge plan would be recurrence of psis, metastatic MSSA infection, and death leading to endorgan damage. Ultimately, Leonard Vargas chose to forgo further treatment in the inpatient setting.      Assessment and Plan:  MSSA bacteremia -Source is iSSTI -Continue cefazolin -Repeated blood cultures -Echocardiogram -MRI right forearm--informed pt he needs to contact his parole officer the come here to remove ankle monitor, which he has called   Polysubstance abuse -Including fentanyl, tobacco -Cessation discussed   Right arm cellulitis/abscess -MR of right arm if patient can get his parole officer to remove his ankle bracelet -continue cefazolin -pt has to contact his parole officer to take off his ankle monitor in order to perform   Opioid Use Disorder -started buprenorphine -started clonidine   Tobacco abuse -nicoderm patch      Consultants: ID Procedures performed: none  Disposition: AGAINST MEDICAL ADVICE  DISCHARGE MEDICATION: Allergies as of 08/05/2023   No Known Allergies      Medication List     TAKE these medications    cephALEXin 500 MG capsule Commonly known as: KEFLEX Take 1 capsule (500 mg total) by mouth 4 (four) times daily.   naloxone 4 MG/0.1ML Liqd nasal spray kit Commonly known as: NARCAN Place 1 spray into the nose once.        Discharge Exam: Filed Weights   08/04/23 1335  Weight: 79.4 kg   HEENT:  [place]/AT, No thrush, no icterus CV:  RRR, no rub, no S3, no S4 Lung:  CTA, no wheeze, no rhonchi Abd:  soft/+BS, NT Ext:  No edema, no lymphangitis, no synovitis, no rash   Condition at discharge: stable  The results of significant diagnostics from this hospitalization (including imaging, microbiology, ancillary and laboratory) are listed below for reference.   Imaging Studies: DG Chest Port 1 View  Result Date: 08/04/2023 CLINICAL DATA:  Sepsis.  EXAM: PORTABLE CHEST 1 VIEW COMPARISON:  08/01/2023 FINDINGS: The heart size and mediastinal contours are within normal limits. Both lungs are clear. The visualized skeletal structures are unremarkable. IMPRESSION: No active disease. Electronically Signed   By: Danae Orleans M.D.   On: 08/04/2023 16:20   DG Chest 2 View  Result Date: 08/01/2023 CLINICAL DATA:  Suspected sepsis EXAM: CHEST - 2 VIEW COMPARISON:  10/24/2014 FINDINGS: The heart size and mediastinal contours are within normal limits. Both lungs are clear. The visualized skeletal structures are unremarkable. IMPRESSION: No active cardiopulmonary disease. Electronically Signed   By: Charlett Nose M.D.   On: 08/01/2023 19:15    Microbiology: Results for orders placed or performed during the hospital encounter of 08/04/23  Blood Culture (routine x 2)     Status: None (Preliminary result)   Collection Time: 08/04/23  2:19 PM   Specimen: Left Antecubital; Blood  Result Value Ref Range Status   Specimen Description LEFT ANTECUBITAL AEROBIC BOTTLE ONLY  Final   Special Requests   Final    Blood Culture results may not  be optimal due to an inadequate volume of blood received in culture bottles   Culture   Final    NO GROWTH < 24 HOURS Performed at [facility], [address] [place], [place] [address]    Report Status PENDING  Incomplete  Blood Culture (routine x 2)     Status: None (Preliminary result)   Collection Time: 08/04/23  2:19 PM   Specimen: Right Antecubital; Blood  Result Value Ref Range Status   Specimen Description RIGHT ANTECUBITAL AEROBIC BOTTLE ONLY  Final   Special Requests   Final    Blood Culture results may not be optimal due to an inadequate volume of blood received in culture bottles   Culture   Final    NO GROWTH < 24 HOURS Performed at [facility], [address], [place], [place] [address]    Report Status PENDING  Incomplete  Resp panel by RT-PCR (RSV, Flu A&B, Covid) Anterior Nasal Swab     Status: None    Collection Time: 08/04/23  3:50 PM   Specimen: Anterior Nasal Swab  Result Value Ref Range Status   SARS Coronavirus 2 by RT PCR NEGATIVE NEGATIVE Final    Comment: (NOTE) SARS-CoV-2 target nucleic acids are NOT DETECTED.  The SARS-CoV-2 RNA is generally detectable in upper respiratory specimens during the acute phase of infection. The lowest concentration of SARS-CoV-2 viral copies this assay can detect is 138 copies/mL. A negative result does not preclude SARS-Cov-2 infection and should not be used as the sole basis for treatment or other patient management decisions. A negative result may occur with  improper specimen collection/handling, submission of specimen other than nasopharyngeal swab, presence of viral mutation(s) within the areas targeted by this assay, and inadequate number of viral copies(<138 copies/mL). A negative result must be combined with clinical observations, patient history, and epidemiological information. The expected result is Negative.  Fact Sheet for Patients:  BloggerCourse.com  Fact Sheet for Healthcare Providers:  SeriousBroker.it  This test is no t yet approved or cleared by the [place] FDA and  has been authorized for detection and/or diagnosis of SARS-CoV-2 by FDA under an Emergency Use Authorization (EUA). This EUA will remain  in effect (meaning this test can be used) for the duration of the COVID-19 declaration under Section 564(b)(1) of the Act, 21 U.S.C.section 360bbb-3(b)(1), unless the authorization is terminated  or revoked sooner.       Influenza A by PCR NEGATIVE NEGATIVE Final   Influenza B by PCR NEGATIVE NEGATIVE Final    Comment: (NOTE) The Xpert Xpress SARS-CoV-2/FLU/RSV plus assay is intended as an aid in the diagnosis of influenza from Nasopharyngeal swab specimens and should not be used as a sole basis for treatment. Nasal washings and aspirates are unacceptable for  Xpert Xpress SARS-CoV-2/FLU/RSV testing.  Fact Sheet for Patients: BloggerCourse.com  Fact Sheet for Healthcare Providers: SeriousBroker.it  This test is not yet approved or cleared by the [place] FDA and has been authorized for detection and/or diagnosis of SARS-CoV-2 by FDA under an Emergency Use Authorization (EUA). This EUA will remain in effect (meaning this test can be used) for the duration of the COVID-19 declaration under Section 564(b)(1) of the Act, 21 U.S.C. section 360bbb-3(b)(1), unless the authorization is terminated or revoked.     Resp Syncytial Virus by PCR NEGATIVE NEGATIVE Final    Comment: (NOTE) Fact Sheet for Patients: BloggerCourse.com  Fact Sheet for Healthcare Providers: SeriousBroker.it  This test is not yet approved or cleared by  the [employer] and has been authorized for detection and/or diagnosis of SARS-CoV-2 by FDA under an Emergency Use Authorization (EUA). This EUA will remain in effect (meaning this test can be used) for the duration of the COVID-19 declaration under Section 564(b)(1) of the Act, 21 U.S.C. section 360bbb-3(b)(1), unless the authorization is terminated or revoked.  Performed at [facility], [address] [place], [place] [address]   Aerobic/Anaerobic Culture w Gram Stain (surgical/deep wound)     Status: None (Preliminary result)   Collection Time: 08/04/23  4:06 PM   Specimen: WRIST; Abscess  Result Value Ref Range Status   Specimen Description   Final    WRIST Performed at [facility], [address] [place], [place] [address]    Special Requests   Final    NONE Performed at [facility], [address] [place], [place] [address]    Gram Stain   Final    RARE WBC PRESENT, PREDOMINANTLY PMN NO ORGANISMS SEEN    Culture   Final    NO GROWTH < 12 HOURS Performed at [facility], [address] [place], [place] [address]    Report Status PENDING  Incomplete    Labs: CBC: Recent Labs  Lab 08/01/23 1749 08/04/23 1506 08/05/23 0440  WBC 7.8 7.6 6.0  NEUTROABS 5.9 4.5 3.1  HGB 12.2* 13.1 11.1*  HCT 38.7* 42.1 35.2*  MCV 88.0 88.3 87.1  PLT 226 256 249   Basic Metabolic Panel: Recent Labs  Lab 08/01/23 1749 08/04/23 1506 08/05/23 0440  NA 136 137 138  K 3.7 3.9 3.8  CL 98 100 102  CO2 30 27 27   GLUCOSE 124* 91 102*  BUN 8 9 9   CREATININE 0.76 0.70 0.69  CALCIUM 8.4* 8.8* 8.7*  MG  --   --  2.3   Liver Function Tests: Recent Labs  Lab 08/01/23 1749 08/04/23 1506 08/05/23 0440  AST 17 16 11*  ALT 13 13 11   ALKPHOS 57 59 51  BILITOT 0.6 0.4 0.4  PROT 7.5 8.4* 6.5  ALBUMIN 3.7 4.1 3.2*   CBG: No results for input(s): "GLUCAP" in the last 168 hours.  Discharge time spent: greater than 30 minutes.  Signed: Catarina Hartshorn, MD [facility] 08/05/2023

## 2023-08-05 NOTE — Progress Notes (Signed)
*  PRELIMINARY RESULTS* Echocardiogram 2D Echocardiogram has been performed.  Carolyne Fiscal 08/05/2023, 9:10 AM

## 2023-08-05 NOTE — Hospital Course (Addendum)
32 year old male with a history of polysubstance abuse including intravenous fentanyl abuse presenting secondary to positive blood cultures. 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  At that time, the patient stated that he had missed his vein in his right forearm during one of his injections and developed some redness and swelling in his right forearm.  This was associate with some subjective fevers and chills.  Blood cultures and all the lab work was performed during that ED visit.  His BMP and CBC were largely unremarkable.  His RPR was nonreactive.  Unfortunately, the patient eloped prior to his workup being finished. ID physician, Dr. Elinor Parkinson tried to contact the patient initially on the morning of 08/03/2023 and was unable to reach the patient. He returned to the emergency department on 08/03/2023 because of pain and redness in his right forearm and wrist.  Once again, he eloped from the emergency department.  He returned to the emergency department again on 08/04/2023 because of pain and redness in his right forearm and wrist.  He states that he had to pop the boil on the dorsum of his right wrist because of the pain.  He states he had note injected at that site for "a few months", but states he has injected distal to it in the metacarpal area. He had had a small infection to the right forearm a couple of days ago where he had missed a vein and injected;  he states this area has gotten better but more distally, he has a large boil on the right wrist, states it drained pus last night after he tried to drain it with a sewing needle.   In the ED on 08/04/2023, the abscess on the dorsum of his right wrist was incised and drained and wound culture was sent.  It was noted that the patient had no joint swelling or limitations of range of motion of his right wrist. Patient denies fevers, chills,  headache, chest pain, dyspnea, nausea, vomiting, diarrhea, abdominal pain, dysuria, hematuria, hematochezia, and melena.  In the ED, the patient had low-grade temperature 99.4 F.  He was hemodynamically stable.  Oxygen saturation was 100% room air.  Blood cultures from 08/01/2023 showed MSSA in 1 of 2 sets.  Repeat blood cultures were obtained in the emergency department on 08/04/2023 at 1419 hours. WBC 7.8, hemoglobin 13.1, platelets 256,000.  Lactic acid 1.4>> 1.5.  Sodium 137, potassium 3.9, bicarbonate 27, serum creatinine 0.70.  The patient was given a dose of vancomycin initially.  He has been since started on cefazolin.  He was admitted for further evaluation and treatment.

## 2023-08-05 NOTE — Progress Notes (Signed)
PROGRESS NOTE  Leonard Vargas EAV:[id number] DOB: [birth year] DOA: 08/04/2023 PCP: Patient, No Pcp Per  Brief History:  32 year old male with a history of polysubstance abuse including intravenous fentanyl abuse presenting secondary to positive blood cultures. 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  At that time, the patient stated that he had missed his vein in his right forearm during one of his injections and developed some redness and swelling in his right forearm.  This was associate with some subjective fevers and chills.  Blood cultures and all the lab work was performed during that ED visit.  His BMP and CBC were largely unremarkable.  His RPR was nonreactive.  Unfortunately, the patient eloped prior to his workup being finished. ID physician, Dr. Elinor Parkinson tried to contact the patient initially on the morning of 08/03/2023 and was unable to reach the patient. He returned to the emergency department on 08/03/2023 because of pain and redness in his right forearm and wrist.  Once again, he eloped from the emergency department.  He returned to the emergency department again on 08/04/2023 because of pain and redness in his right forearm and wrist.  He states that he had to pop the boil on the dorsum of his right wrist because of the pain.  He states he had note injected at that site for "a few months", but states he has injected distal to it in the metacarpal area. He had had a small infection to the right forearm a couple of days ago where he had missed a vein and injected;  he states this area has gotten better but more distally, he has a large boil on the right wrist, states it drained pus last night after he tried to drain it with a sewing needle.   In the ED on 08/04/2023, the abscess on the dorsum of his right wrist was incised and drained and wound culture was sent.  It was  noted that the patient had no joint swelling or limitations of range of motion of his right wrist. Patient denies fevers, chills, headache, chest pain, dyspnea, nausea, vomiting, diarrhea, abdominal pain, dysuria, hematuria, hematochezia, and melena.  In the ED, the patient had low-grade temperature 99.4 F.  He was hemodynamically stable.  Oxygen saturation was 100% room air.  Blood cultures from 08/01/2023 showed MSSA in 1 of 2 sets.  Repeat blood cultures were obtained in the emergency department on 08/04/2023 at 1419 hours. WBC 7.8, hemoglobin 13.1, platelets 256,000.  Lactic acid 1.4>> 1.5.  Sodium 137, potassium 3.9, bicarbonate 27, serum creatinine 0.70.  The patient was given a dose of vancomycin initially.  He has been since started on cefazolin.  He was admitted for further evaluation and treatment.   Assessment/Plan:  MSSA bacteremia -Source is iSSTI -Continue cefazolin -Repeated blood cultures -Echocardiogram -MRI right forearm  Polysubstance abuse -Including fentanyl, tobacco -Cessation discussed  Right arm cellulitis/abscess -MR of right arm if patient can get his parole officer to remove his ankle bracelet -continue cefazolin  Opioid Use Disorder -start buprenorphine -start clonidine  Tobacco abuse -nicoderm pathc       Family Communication:  no Family at bedside  Consultants:  ID  Code Status:  FULL   DVT Prophylaxis:  [place] Heparin    Procedures: As Listed in Progress Note Above  Antibiotics: None       Subjective:  Pt complains of pain in right forearm.  Denies f/c, cp, sob, vomiting.  Has some nausea  Objective: Vitals:   08/04/23 1730 08/04/23 1824 08/05/23 0020 08/05/23 0432  BP: 129/78 (!) 116/93 (!) 103/53 (!) 107/48  Pulse:  85 92 92  Resp: 15 17 18 19   Temp:  98.5 F (36.9 C) 98.5 F (36.9 C) 98.8 F (37.1 C)  TempSrc:  Oral Oral Oral  SpO2:  100% 100% 99%  Weight:      Height:       No intake or output data in the 24 hours  ending 08/05/23 0821 Weight change:  Exam:  General:  Pt is alert, follows commands appropriately, not in acute distress HEENT: No icterus, No thrush, No neck mass, [place]/AT Cardiovascular: RRR, S1/S2, no rubs, no gallops Respiratory: CTA bilaterally, no wheezing, no crackles, no rhonchi Abdomen: Soft/+BS, non tender, non distended, no guarding Extremities: No edema, No lymphangitis, No petechiae, No rashes, no synovitis Right fore arm>>>       Data Reviewed: I have personally reviewed following labs and imaging studies Basic Metabolic Panel: Recent Labs  Lab 08/01/23 1749 08/04/23 1506 08/05/23 0440  NA 136 137 138  K 3.7 3.9 3.8  CL 98 100 102  CO2 30 27 27   GLUCOSE 124* 91 102*  BUN 8 9 9   CREATININE 0.76 0.70 0.69  CALCIUM 8.4* 8.8* 8.7*  MG  --   --  2.3   Liver Function Tests: Recent Labs  Lab 08/01/23 1749 08/04/23 1506 08/05/23 0440  AST 17 16 11*  ALT 13 13 11   ALKPHOS 57 59 51  BILITOT 0.6 0.4 0.4  PROT 7.5 8.4* 6.5  ALBUMIN 3.7 4.1 3.2*   No results for input(s): "LIPASE", "AMYLASE" in the last 168 hours. No results for input(s): "AMMONIA" in the last 168 hours. Coagulation Profile: Recent Labs  Lab 08/01/23 1749 08/04/23 1506  INR 1.2 1.1   CBC: Recent Labs  Lab 08/01/23 1749 08/04/23 1506 08/05/23 0440  WBC 7.8 7.6 6.0  NEUTROABS 5.9 4.5 3.1  HGB 12.2* 13.1 11.1*  HCT 38.7* 42.1 35.2*  MCV 88.0 88.3 87.1  PLT 226 256 249   Cardiac Enzymes: No results for input(s): "CKTOTAL", "CKMB", "CKMBINDEX", "TROPONINI" in the last 168 hours. BNP: Invalid input(s): "POCBNP" CBG: No results for input(s): "GLUCAP" in the last 168 hours. HbA1C: No results for input(s): "HGBA1C" in the last 72 hours. Urine analysis:    Component Value Date/Time   COLORURINE YELLOW 08/04/2023 1550   APPEARANCEUR CLEAR 08/04/2023 1550   LABSPEC 1.012 08/04/2023 1550   PHURINE 7.0 08/04/2023 1550   GLUCOSEU NEGATIVE 08/04/2023 1550   HGBUR NEGATIVE  08/04/2023 1550   BILIRUBINUR NEGATIVE 08/04/2023 1550   KETONESUR NEGATIVE 08/04/2023 1550   PROTEINUR NEGATIVE 08/04/2023 1550   UROBILINOGEN 0.2 10/24/2014 1718   NITRITE NEGATIVE 08/04/2023 1550   LEUKOCYTESUR NEGATIVE 08/04/2023 1550   Sepsis Labs: @LABRCNTIP (procalcitonin:4,lacticidven:4) ) Recent Results (from the past 240 hour(s))  Culture, blood (Routine x 2)     Status: None (Preliminary result)   Collection Time: 08/01/23  5:49 PM   Specimen: BLOOD  Result Value Ref Range Status   Specimen Description BLOOD BLOOD RIGHT ARM bicep rt  Final   Special Requests   Final    BOTTLES DRAWN AEROBIC AND ANAEROBIC Blood Culture adequate volume   Culture   Final    NO GROWTH 4 DAYS Performed at [facility], [address] [place], [place] [address]  Report Status PENDING  Incomplete  Culture, blood (Routine x 2)     Status: Abnormal   Collection Time: 08/01/23  5:57 PM   Specimen: BLOOD  Result Value Ref Range Status   Specimen Description   Final    BLOOD LEFT ANTECUBITAL Performed at [facility], [address], [place], [place] [address]    Special Requests   Final    BOTTLES DRAWN AEROBIC AND ANAEROBIC Blood Culture adequate volume Performed at [facility], [address], [place], [place] [address]    Culture  Setup Time   Final    GRAM POSITIVE COCCI Gram Stain Report Called to,Read Back By and Verified With: TURNER,C @ 1836 ON 08/02/23 BY JUW AEROBIC BOTTLE ONLY GS DONE @ [facility] CRITICAL RESULT CALLED TO, READ BACK BY AND VERIFIED WITH: RN JEFFREY SAPPELT ON 08/02/23 @ 2306 BY DRT Performed at [facility], [address] [place], [place] [address]    Culture STAPHYLOCOCCUS AUREUS (A)  Final   Report Status 08/04/2023 FINAL  Final   Organism ID, Bacteria STAPHYLOCOCCUS AUREUS  Final      Susceptibility   Staphylococcus aureus - MIC*    CIPROFLOXACIN <=0.5 SENSITIVE Sensitive     ERYTHROMYCIN >=8 RESISTANT Resistant     GENTAMICIN <=0.5 SENSITIVE Sensitive      OXACILLIN 0.5 SENSITIVE Sensitive     TETRACYCLINE <=1 SENSITIVE Sensitive     VANCOMYCIN 1 SENSITIVE Sensitive     TRIMETH/SULFA <=10 SENSITIVE Sensitive     CLINDAMYCIN <=0.25 SENSITIVE Sensitive     RIFAMPIN <=0.5 SENSITIVE Sensitive     Inducible Clindamycin NEGATIVE Sensitive     LINEZOLID 2 SENSITIVE Sensitive     * STAPHYLOCOCCUS AUREUS  Blood Culture ID Panel (Reflexed)     Status: Abnormal   Collection Time: 08/01/23  5:57 PM  Result Value Ref Range Status   Enterococcus faecalis NOT DETECTED NOT DETECTED Final   Enterococcus Faecium NOT DETECTED NOT DETECTED Final   Listeria monocytogenes NOT DETECTED NOT DETECTED Final   Staphylococcus species DETECTED (A) NOT DETECTED Final    Comment: CRITICAL RESULT CALLED TO, READ BACK BY AND VERIFIED WITH: RN JEFFREY SAPPELT ON 08/02/23 @ 2306 BY DRT    Staphylococcus aureus (BCID) DETECTED (A) NOT DETECTED Final    Comment: CRITICAL RESULT CALLED TO, READ BACK BY AND VERIFIED WITH: RN JEFFREY SAPPELT ON 08/02/23 @ 2306 BY DRT    Staphylococcus epidermidis NOT DETECTED NOT DETECTED Final   Staphylococcus lugdunensis NOT DETECTED NOT DETECTED Final   Streptococcus species NOT DETECTED NOT DETECTED Final   Streptococcus agalactiae NOT DETECTED NOT DETECTED Final   Streptococcus pneumoniae NOT DETECTED NOT DETECTED Final   Streptococcus pyogenes NOT DETECTED NOT DETECTED Final   A.calcoaceticus-baumannii NOT DETECTED NOT DETECTED Final   Bacteroides fragilis NOT DETECTED NOT DETECTED Final   Enterobacterales NOT DETECTED NOT DETECTED Final   Enterobacter cloacae complex NOT DETECTED NOT DETECTED Final   Escherichia coli NOT DETECTED NOT DETECTED Final   Klebsiella aerogenes NOT DETECTED NOT DETECTED Final   Klebsiella oxytoca NOT DETECTED NOT DETECTED Final   Klebsiella pneumoniae NOT DETECTED NOT DETECTED Final   Proteus species NOT DETECTED NOT DETECTED Final   Salmonella species NOT DETECTED NOT DETECTED Final   Serratia  marcescens NOT DETECTED NOT DETECTED Final   Haemophilus influenzae NOT DETECTED NOT DETECTED Final   Neisseria meningitidis NOT DETECTED NOT DETECTED Final   Pseudomonas aeruginosa NOT DETECTED NOT DETECTED Final   Stenotrophomonas maltophilia NOT  DETECTED NOT DETECTED Final   Candida albicans NOT DETECTED NOT DETECTED Final   Candida auris NOT DETECTED NOT DETECTED Final   Candida glabrata NOT DETECTED NOT DETECTED Final   Candida krusei NOT DETECTED NOT DETECTED Final   Candida parapsilosis NOT DETECTED NOT DETECTED Final   Candida tropicalis NOT DETECTED NOT DETECTED Final   Cryptococcus neoformans/gattii NOT DETECTED NOT DETECTED Final   Meth resistant mecA/C and MREJ NOT DETECTED NOT DETECTED Final    Comment: Performed at [facility], [address], [place], [place] [address]  Blood Culture (routine x 2)     Status: None (Preliminary result)   Collection Time: 08/04/23  2:19 PM   Specimen: Left Antecubital; Blood  Result Value Ref Range Status   Specimen Description LEFT ANTECUBITAL AEROBIC BOTTLE ONLY  Final   Special Requests   Final    Blood Culture results may not be optimal due to an inadequate volume of blood received in culture bottles   Culture   Final    NO GROWTH < 24 HOURS Performed at [facility], [address] [place], [place] [address]    Report Status PENDING  Incomplete  Blood Culture (routine x 2)     Status: None (Preliminary result)   Collection Time: 08/04/23  2:19 PM   Specimen: Right Antecubital; Blood  Result Value Ref Range Status   Specimen Description RIGHT ANTECUBITAL AEROBIC BOTTLE ONLY  Final   Special Requests   Final    Blood Culture results may not be optimal due to an inadequate volume of blood received in culture bottles   Culture   Final    NO GROWTH < 24 HOURS Performed at [facility], [address], [place], [place] [address]    Report Status PENDING  Incomplete  Resp panel by RT-PCR (RSV, Flu A&B, Covid) Anterior Nasal Swab      Status: None   Collection Time: 08/04/23  3:50 PM   Specimen: Anterior Nasal Swab  Result Value Ref Range Status   SARS Coronavirus 2 by RT PCR NEGATIVE NEGATIVE Final    Comment: (NOTE) SARS-CoV-2 target nucleic acids are NOT DETECTED.  The SARS-CoV-2 RNA is generally detectable in upper respiratory specimens during the acute phase of infection. The lowest concentration of SARS-CoV-2 viral copies this assay can detect is 138 copies/mL. A negative result does not preclude SARS-Cov-2 infection and should not be used as the sole basis for treatment or other patient management decisions. A negative result may occur with  improper specimen collection/handling, submission of specimen other than nasopharyngeal swab, presence of viral mutation(s) within the areas targeted by this assay, and inadequate number of viral copies(<138 copies/mL). A negative result must be combined with clinical observations, patient history, and epidemiological information. The expected result is Negative.  Fact Sheet for Patients:  BloggerCourse.com  Fact Sheet for Healthcare Providers:  SeriousBroker.it  This test is no t yet approved or cleared by the [place] FDA and  has been authorized for detection and/or diagnosis of SARS-CoV-2 by FDA under an Emergency Use Authorization (EUA). This EUA will remain  in effect (meaning this test can be used) for the duration of the COVID-19 declaration under Section 564(b)(1) of the Act, 21 U.S.C.section 360bbb-3(b)(1), unless the authorization is terminated  or revoked sooner.       Influenza A by PCR NEGATIVE NEGATIVE Final   Influenza B by PCR NEGATIVE NEGATIVE Final    Comment: (NOTE) The Xpert Xpress SARS-CoV-2/FLU/RSV plus assay is  intended as an aid in the diagnosis of influenza from Nasopharyngeal swab specimens and should not be used as a sole basis for treatment. Nasal washings and aspirates are  unacceptable for Xpert Xpress SARS-CoV-2/FLU/RSV testing.  Fact Sheet for Patients: BloggerCourse.com  Fact Sheet for Healthcare Providers: SeriousBroker.it  This test is not yet approved or cleared by the [place] FDA and has been authorized for detection and/or diagnosis of SARS-CoV-2 by FDA under an Emergency Use Authorization (EUA). This EUA will remain in effect (meaning this test can be used) for the duration of the COVID-19 declaration under Section 564(b)(1) of the Act, 21 U.S.C. section 360bbb-3(b)(1), unless the authorization is terminated or revoked.     Resp Syncytial Virus by PCR NEGATIVE NEGATIVE Final    Comment: (NOTE) Fact Sheet for Patients: BloggerCourse.com  Fact Sheet for Healthcare Providers: SeriousBroker.it  This test is not yet approved or cleared by the [place] FDA and has been authorized for detection and/or diagnosis of SARS-CoV-2 by FDA under an Emergency Use Authorization (EUA). This EUA will remain in effect (meaning this test can be used) for the duration of the COVID-19 declaration under Section 564(b)(1) of the Act, 21 U.S.C. section 360bbb-3(b)(1), unless the authorization is terminated or revoked.  Performed at [facility], [address] [place], [place] [address]   Aerobic/Anaerobic Culture w Gram Stain (surgical/deep wound)     Status: None (Preliminary result)   Collection Time: 08/04/23  4:06 PM   Specimen: WRIST; Abscess  Result Value Ref Range Status   Specimen Description   Final    WRIST Performed at [facility], [address], [place], [place] [address]    Special Requests   Final    NONE Performed at [facility], [address], [place], [place] [address]    Gram Stain   Final    RARE WBC PRESENT, PREDOMINANTLY PMN NO ORGANISMS SEEN    Culture   Final    NO GROWTH < 12 HOURS Performed at [facility], [address] [place], [place] [address]    Report Status PENDING  Incomplete     Scheduled Meds:  clonazePAM  1 mg Oral BID   cloNIDine  0.1 mg Oral TID   heparin  5,000 Units Subcutaneous Q8H   multivitamin with minerals  1 tablet Oral Daily   nicotine  21 mg Transdermal Daily   Continuous Infusions:   ceFAZolin (ANCEF) IV 2 g (08/05/23 0551)   lactated ringers 150 mL/hr at 08/05/23 0550   methocarbamol (ROBAXIN) IV      Procedures/Studies: DG Chest Port 1 View  Result Date: 08/04/2023 CLINICAL DATA:  Sepsis. EXAM: PORTABLE CHEST 1 VIEW COMPARISON:  08/01/2023 FINDINGS: The heart size and mediastinal contours are within normal limits. Both lungs are clear. The visualized skeletal structures are unremarkable. IMPRESSION: No active disease. Electronically Signed   By: Danae Orleans M.D.   On: 08/04/2023 16:20   DG Chest 2 View  Result Date: 08/01/2023 CLINICAL DATA:  Suspected sepsis EXAM: CHEST - 2 VIEW COMPARISON:  10/24/2014 FINDINGS: The heart size and mediastinal contours are within normal limits. Both lungs are clear. The visualized skeletal structures are unremarkable. IMPRESSION: No active cardiopulmonary disease. Electronically Signed   By: Charlett Nose M.D.   On: 08/01/2023 19:15    Catarina Hartshorn, DO  [facility]  If 7PM-7AM, please contact night-coverage [email or web address] Password [email or web address] 08/05/2023, 8:21 AM   LOS: 0 days

## 2023-08-05 NOTE — Progress Notes (Signed)
Patient attempted to contact probation officer to get ankle monitor removed to have MRI done. Patient stated he wanted to leave facility against medical advice, that he had court dates and other things needing to be done. MD Tat made aware. Patient educated by this Clinical research associate and Nadeen Landau, patient requested to leave AMA. Patient signed AMA form, witnessed by this Clinical research associate.

## 2023-08-05 NOTE — TOC Initial Note (Signed)
Transition of Care [facility]) - Initial/Assessment Note    Patient Details  Name: Leonard Vargas MRN: [id number] Date of Birth: Mar 16, 1991  Transition of [facility]) CM/SW Contact:    Villa Herb, LCSWA Phone Number: 08/05/2023, 9:43 AM  Clinical Narrative:                 [facility] consulted for substance use resources and Safe injection, needle exchange programs, signs and symptoms of overdose. CSW spoke with pt and he is interested in resources at this time. CSW added substance use resources to AVS for pt to review at D/C. Resource packet for safe injection and needle exchange programs to be provided to pt at bedside. TOC to follow.   Expected Discharge Plan: Home/Self Care Barriers to Discharge: Continued Medical Work up   Patient Goals and CMS Choice Patient states their goals for this hospitalization and ongoing recovery are:: return home CMS Medicare.gov Compare Post Acute Care list provided to:: Patient Choice offered to / list presented to : Patient      Expected Discharge Plan and Services In-house Referral: Clinical Social Work Discharge Planning Services: CM Consult   Living arrangements for the past 2 months: Single Family Home                                      Prior Living Arrangements/Services Living arrangements for the past 2 months: Single Family Home Lives with:: Self Patient language and need for interpreter reviewed:: Yes Do you feel safe going back to the place where you live?: Yes      Need for Family Participation in Patient Care: Yes (Comment) Care giver support system in place?: Yes (comment)   Criminal Activity/Legal Involvement Pertinent to Current Situation/Hospitalization: No - Comment as needed  Activities of Daily Living Home Assistive Devices/Equipment: None ADL Screening (condition at time of admission) Patient's cognitive ability adequate to safely complete daily activities?: Yes Is the patient deaf or have difficulty hearing?:  No Does the patient have difficulty seeing, even when wearing glasses/contacts?: No Does the patient have difficulty concentrating, remembering, or making decisions?: No Patient able to express need for assistance with ADLs?: Yes Does the patient have difficulty dressing or bathing?: No Independently performs ADLs?: Yes (appropriate for developmental age) Does the patient have difficulty walking or climbing stairs?: No Weakness of Legs: None Weakness of Arms/Hands: Right  Permission Sought/Granted                  Emotional Assessment Appearance:: Appears stated age Attitude/Demeanor/Rapport: Engaged Affect (typically observed): Accepting Orientation: : Oriented to Self, Oriented to Place, Oriented to  Time, Oriented to Situation Alcohol / Substance Use: Not Applicable Psych Involvement: No (comment)  Admission diagnosis:  Bacteremia [R78.81] Abscess of bursa of right wrist [M71.031] Patient Active Problem List   Diagnosis Date Noted   Bacteremia 08/04/2023   Substance abuse (HCC) 08/04/2023   Tobacco use disorder 08/04/2023   PCP:  Patient, No Pcp Per Pharmacy:   [facility] [address] [place] - [address] [place] [address] Phone: [phone] Fax: [phone]     Social Determinants of Health (SDOH) Social History: SDOH Screenings   Tobacco Use: High Risk (08/04/2023)   SDOH Interventions:     Readmission Risk Interventions     No data to display

## 2023-08-07 LAB — CULTURE, BLOOD (ROUTINE X 2)
Culture: NO GROWTH
Special Requests: ADEQUATE

## 2023-08-09 LAB — CULTURE, BLOOD (ROUTINE X 2)
Culture: NO GROWTH
Culture: NO GROWTH

## 2023-08-09 LAB — AEROBIC/ANAEROBIC CULTURE W GRAM STAIN (SURGICAL/DEEP WOUND)
